# Patient Record
Sex: Male | Born: 1970
Health system: Southern US, Community
[De-identification: ages and names within clinical notes are randomized; demographics above are authoritative.]

## PROBLEM LIST (undated history)

## (undated) DIAGNOSIS — E119 Type 2 diabetes mellitus without complications: Secondary | ICD-10-CM

## (undated) DIAGNOSIS — J309 Allergic rhinitis, unspecified: Secondary | ICD-10-CM

## (undated) DIAGNOSIS — I4891 Unspecified atrial fibrillation: Secondary | ICD-10-CM

## (undated) DIAGNOSIS — N200 Calculus of kidney: Secondary | ICD-10-CM

## (undated) HISTORY — DX: Unspecified atrial fibrillation: I48.91

## (undated) HISTORY — DX: Calculus of kidney: N20.0

## (undated) HISTORY — DX: Type 2 diabetes mellitus without complications: E11.9

## (undated) HISTORY — DX: Allergic rhinitis, unspecified: J30.9

## (undated) HISTORY — PX: HERNIA REPAIR: SHX51

---

## 2013-03-01 ENCOUNTER — Ambulatory Visit: Payer: Self-pay | Admitting: Gastroenterology

## 2013-04-09 ENCOUNTER — Ambulatory Visit: Payer: Self-pay | Admitting: Gastroenterology

## 2016-03-22 ENCOUNTER — Encounter: Payer: Self-pay | Admitting: Urology

## 2016-03-22 ENCOUNTER — Ambulatory Visit (INDEPENDENT_AMBULATORY_CARE_PROVIDER_SITE_OTHER): Payer: Commercial Managed Care - HMO | Admitting: Urology

## 2016-03-22 VITALS — BP 129/80 | HR 72 | Ht 72.0 in | Wt 216.3 lb

## 2016-03-22 DIAGNOSIS — R110 Nausea: Secondary | ICD-10-CM

## 2016-03-22 DIAGNOSIS — R3129 Other microscopic hematuria: Secondary | ICD-10-CM | POA: Diagnosis not present

## 2016-03-22 DIAGNOSIS — N201 Calculus of ureter: Secondary | ICD-10-CM

## 2016-03-22 DIAGNOSIS — N132 Hydronephrosis with renal and ureteral calculous obstruction: Secondary | ICD-10-CM | POA: Diagnosis not present

## 2016-03-22 DIAGNOSIS — R109 Unspecified abdominal pain: Secondary | ICD-10-CM | POA: Diagnosis not present

## 2016-03-22 LAB — URINALYSIS, COMPLETE
Bilirubin, UA: NEGATIVE
Glucose, UA: NEGATIVE
LEUKOCYTES UA: NEGATIVE
NITRITE UA: NEGATIVE
SPEC GRAV UA: 1.025 (ref 1.005–1.030)
Urobilinogen, Ur: 0.2 mg/dL (ref 0.2–1.0)
pH, UA: 5.5 (ref 5.0–7.5)

## 2016-03-22 LAB — MICROSCOPIC EXAMINATION: BACTERIA UA: NONE SEEN

## 2016-03-22 MED ORDER — ONDANSETRON 4 MG PO TBDP: 4.0000 mg | ORAL_TABLET | Freq: Three times a day (TID) | ORAL | Status: DC | PRN

## 2016-03-22 MED ORDER — PROMETHAZINE HCL 25 MG/ML IJ SOLN
25.0000 mg | Freq: Four times a day (QID) | INTRAMUSCULAR | Status: DC | PRN
  Administered 2016-03-22: 25 mg via INTRAMUSCULAR

## 2016-03-22 MED ORDER — KETOROLAC TROMETHAMINE 60 MG/2ML IM SOLN
60.0000 mg | Freq: Once | INTRAMUSCULAR | Status: AC
  Administered 2016-03-22: 60 mg via INTRAMUSCULAR

## 2016-03-22 MED ORDER — TAMSULOSIN HCL 0.4 MG PO CAPS: 0.4000 mg | ORAL_CAPSULE | Freq: Every day | ORAL | Status: DC

## 2016-03-22 NOTE — Progress Notes (Signed)
03/22/2016 9:34 AM   Meyer Cory [birth year] [id number]  Referring provider: No referring provider defined for this encounter.  Chief Complaint  Patient presents with  . Nephrolithiasis    Patient seen in ER at [facility]    HPI: Patient is a 45 year old Caucasian male who was seen at [facility] ED in [facility] for a right mid ureteral stone presented today requesting an urgent appointment.  Patient had the sudden onset of right flank that radiated to his right side two days ago.  The pain was unbearable.  He was seen at [facility] ED on 03/20/2016.   A CT Renal Stone was performed and it noted an obstructing right mid ureteral stone.  It was approximately 2 mm in size.  I have personally reviewed the films.  A final radiology report was not available at the time of visit.  He was discharged with tamsulosin, Percocet 5/325 and Zofran 4 mg and instructed to follow up with an urologist.    Today, ain is a 10/10.  The pain is described as sharp.  It started this morning and has not abated.  It is now radiating into the right groin.  The pain is relieved by nothing and made worse by nothing. He denies gross hematuria, fevers and chills.  He is having nausea and vomiting.  He has not been able to keep food, pills or liquid down.    He does not have a previous stone hx.  He has a family hx of stones with both parents having stones.    His UA is positive for 3-10 RBC's/hpf on today's visit.     PMH: Past Medical History  Diagnosis Date  . Kidney stone     Surgical History: Past Surgical History  Procedure Laterality Date  . Hernia repair      Home Medications:    Medication List       This list is accurate as of: 03/22/16  9:34 AM.  Always use your most recent med list.               ondansetron 4 MG disintegrating tablet  Commonly known as:  ZOFRAN ODT  Take 1 tablet (4 mg total) by mouth every 8 (eight) hours as needed for nausea or vomiting.     ondansetron 4 MG tablet  Commonly known as:  ZOFRAN  Take 4 mg by mouth every 8 (eight) hours as needed for nausea or vomiting.     oxyCODONE-acetaminophen 5-325 MG tablet  Commonly known as:  PERCOCET/ROXICET  Take 1-2 tablets by mouth every 4 (four) hours as needed for severe pain.     tamsulosin 0.4 MG Caps capsule  Commonly known as:  FLOMAX  Take 1 capsule (0.4 mg total) by mouth daily.        Allergies: No Known Allergies  Family History: Family History  Problem Relation Age of Onset  . Kidney disease Neg Hx   . Prostate cancer Neg Hx   . Kidney Stones Father   . Kidney Stones Mother     Social History:  reports that he has never smoked. He does not have any smokeless tobacco history on file. He reports that he does not drink alcohol or use illicit drugs.  ROS: UROLOGY Frequent Urination?: No Hard to postpone urination?: No Burning/pain with urination?: No Get up at night to urinate?: No Leakage of urine?: No Urine stream starts and stops?: No Trouble starting stream?: No Do you  have to strain to urinate?: No Blood in urine?: Yes Urinary tract infection?: No Sexually transmitted disease?: No Injury to kidneys or bladder?: No Painful intercourse?: No Weak stream?: No Erection problems?: No Penile pain?: No  Gastrointestinal Nausea?: No Vomiting?: No Indigestion/heartburn?: No Diarrhea?: No Constipation?: No  Constitutional Fever: No Night sweats?: No Weight loss?: No Fatigue?: No  Skin Skin rash/lesions?: No Itching?: No  Eyes Blurred vision?: No Double vision?: No  Ears/Nose/Throat Sore throat?: No Sinus problems?: No  Hematologic/Lymphatic Swollen glands?: No Easy bruising?: No  Cardiovascular Leg swelling?: No Chest pain?: No  Respiratory Cough?: No Shortness of breath?: No  Endocrine Excessive thirst?: No  Musculoskeletal Back pain?: No Joint pain?: No  Neurological Headaches?: No Dizziness?:  No  Psychologic Depression?: No Anxiety?: No  Physical Exam: BP 129/80 mmHg  Pulse 72  Ht 6' (1.829 m)  Wt 216 lb 4.8 oz (98.113 kg)  BMI 29.33 kg/m2  Constitutional: Well nourished. Alert and oriented, No acute distress. HEENT: [place] AT, moist mucus membranes. Trachea midline, no masses. Cardiovascular: No clubbing, cyanosis, or edema. Respiratory: Normal respiratory effort, no increased work of breathing. GI: Abdomen is soft, non tender, non distended, no abdominal masses.  GU: Right CVA tenderness.  No bladder fullness or masses.   Rectal: Deferred.   Skin: No rashes, bruises or suspicious lesions. Lymph: No cervical or inguinal adenopathy. Neurologic: Grossly intact, no focal deficits, moving all 4 extremities. Psychiatric: Normal mood and affect.  Laboratory Data: Urinalysis Results for orders placed or performed in visit on 03/22/16  Microscopic Examination  Result Value Ref Range   WBC, UA 0-5 0 -  5 /hpf   RBC, UA 3-10 (A) 0 -  2 /hpf   Epithelial Cells (non renal) 0-10 0 - 10 /hpf   Mucus, UA Present (A) Not Estab.   Bacteria, UA None seen None seen/Few  Urinalysis, Complete  Result Value Ref Range   Specific Gravity, UA 1.025 1.005 - 1.030   pH, UA 5.5 5.0 - 7.5   Color, UA Yellow Yellow   Appearance Ur Clear Clear   Leukocytes, UA Negative Negative   Protein, UA Trace (A) Negative/Trace   Glucose, UA Negative Negative   Ketones, UA 2+ (A) Negative   RBC, UA 2+ (A) Negative   Bilirubin, UA Negative Negative   Urobilinogen, Ur 0.2 0.2 - 1.0 mg/dL   Nitrite, UA Negative Negative   Microscopic Examination See below:     Assessment & Plan:    1. Right mid ureteral stone:   [place] ureteral stone approximately 2 mm in size.  We discussed MET, ESWL and URS/LL/ureteral stent placement.  I suggested that due to the size of the stone, MET would be reasonable.  I have given him a strainer and  instructed him to strain his urine.  I have refilled his tamsulosin.  He should contact our office or seek treatment in the ED if he becomes febrile or pain and difficult control in order to arrange for emergent/urgent intervention.   I will request records from [facility] ED.  He will follow up in one week for KUB, UA and exam.    - Urinalysis, Complete  2. Right flank pain:   Patient is given 60 mg IM of Toradol in the office today.  He will continue the Percocet 5/325 given to him by the ED.    3. Nausea:   Patient is given 27  mg IM of Phenergan in the office today.  I have sent a prescription for Zofran 4 mg ODT for the nausea.    4. Hydronephrosis:   Patient was found to have right hydronephrosis due to a right ureteral stone.  A RUS will be obtained one month after he has passed the stone.    5. Microscopic hematuria:   We will continue to monitor the patient's UA after the treatment/passage of the stone to ensure the hematuria has resolved.  If hematuria persists, we will pursue a hematuria workup with CT Urogram and cystoscopy if appropriate.  He will return next week for an UA.     Return in about 1 week (around 03/29/2016) for KUB, UA and exam.  These notes generated with voice recognition software. I apologize for typographical errors.  Zara Council, [facility] [address] [place], [place] [address] [phone]

## 2016-03-22 NOTE — Progress Notes (Signed)
IM Injection  Patient is present today for an IM Injection for treatment of pain from stone. Drug: Toradol Dose:60 mg Location:Right Upper outer Glut Lot: [id number] Exp:10/28/2016 Patient tolerated well, no complications were noted  Preformed by: Dallas Schimkeamona Williams CMA  IM Injection  Patient is present today for an IM Injection for treatment of Kidney stone. Drug: Phenergan Dose:25 mg Location:Left upper outer Glut Lot: [id number] Exp:08/2016 Patient tolerated well, no complications were noted  Preformed by: Dallas Schimkeamona Williams CMA

## 2016-03-29 ENCOUNTER — Encounter: Payer: Self-pay | Admitting: Urology

## 2016-03-29 ENCOUNTER — Ambulatory Visit
Admission: RE | Admit: 2016-03-29 | Discharge: 2016-03-29 | Disposition: A | Discharge status: Home / Self Care | Payer: Commercial Managed Care - HMO | Source: Ambulatory Visit | Attending: Urology | Admitting: Urology

## 2016-03-29 ENCOUNTER — Ambulatory Visit (INDEPENDENT_AMBULATORY_CARE_PROVIDER_SITE_OTHER): Payer: Commercial Managed Care - HMO | Admitting: Urology

## 2016-03-29 ENCOUNTER — Other Ambulatory Visit: Payer: Self-pay | Admitting: *Deleted

## 2016-03-29 VITALS — BP 146/76 | HR 97 | Ht 72.0 in | Wt 210.0 lb

## 2016-03-29 DIAGNOSIS — N201 Calculus of ureter: Secondary | ICD-10-CM | POA: Diagnosis not present

## 2016-03-29 DIAGNOSIS — R3129 Other microscopic hematuria: Secondary | ICD-10-CM

## 2016-03-29 DIAGNOSIS — N132 Hydronephrosis with renal and ureteral calculous obstruction: Secondary | ICD-10-CM

## 2016-03-29 DIAGNOSIS — R109 Unspecified abdominal pain: Secondary | ICD-10-CM | POA: Diagnosis not present

## 2016-03-29 DIAGNOSIS — R938 Abnormal findings on diagnostic imaging of other specified body structures: Secondary | ICD-10-CM | POA: Diagnosis not present

## 2016-03-29 LAB — URINALYSIS, COMPLETE
BILIRUBIN UA: NEGATIVE
Glucose, UA: NEGATIVE
Nitrite, UA: NEGATIVE
PH UA: 5 (ref 5.0–7.5)
PROTEIN UA: NEGATIVE
Specific Gravity, UA: 1.015 (ref 1.005–1.030)
UUROB: 0.2 mg/dL (ref 0.2–1.0)

## 2016-03-29 LAB — MICROSCOPIC EXAMINATION: BACTERIA UA: NONE SEEN

## 2016-03-29 MED ORDER — OXYCODONE-ACETAMINOPHEN 10-325 MG PO TABS: 1.0000 | ORAL_TABLET | ORAL | Status: DC | PRN

## 2016-03-29 NOTE — Progress Notes (Signed)
9:20 AM   Michael Malone 05/22/1971 [id number]  Referring provider: No referring provider defined for this encounter.  Chief Complaint  Patient presents with  . ureteral stone    1 week follow up    HPI: Patient is a 45 year old Caucasian male who presents today for a one week for recheck of UA for hematuria and KUB for a right ureteral stone.  Background history Patient was seen at [facility] ED in [facility] for a right mid ureteral stone presented today requesting an urgent appointment.  Patient had the sudden onset of right flank that radiated to his right side two days ago.  The pain was unbearable.  He was seen at [facility] ED on 03/20/2016.   A CT Renal Stone was performed and it noted an obstructing right mid ureteral stone.  It was approximately 2 mm in size.  I have personally reviewed the films.  A final radiology report was not available at the time of visit.  He was discharged with tamsulosin, Percocet 5/325 and Zofran 4 mg and instructed to follow up with an urologist.  He does not have a previous stone hx.  He has a family hx of stones with both parents having stones.    Over the last week, he was still experiencing renal colic.  On Tuesday and Wednesday his pain was 5-6/10. His pain medication was controlling the pain at that time. Then on Thursday, he had taken all of his pain medication and was seen at [facility] urgent care and given a refill of his pain medication.  The pain continued into Friday. Then on Saturday morning,  he did not experience pain and passed a fleshy material.  He had not been able to tolerate food or liquid over the last week due to nausea and vomiting. On Saturday he was able to tolerate regular food and fluids. He found himself tired and lethargic. Sunday morning he did not experience pain continued to eat normally and increase his fluid intake. Then on Sunday evening he had mild right-sided flank pain that increased to 4-5/10. This morning, he  has not had any further pain.  His urine is positive for greater than 30 WBCs and 11-30 RBCs per high-power field.      PMH: Past Medical History  Diagnosis Date  . Kidney stone     Surgical History: Past Surgical History  Procedure Laterality Date  . Hernia repair      Home Medications:    Medication List       This list is accurate as of: 03/29/16  9:20 AM.  Always use your most recent med list.               ondansetron 4 MG disintegrating tablet  Commonly known as:  ZOFRAN ODT  Take 1 tablet (4 mg total) by mouth every 8 (eight) hours as needed for nausea or vomiting.     ondansetron 4 MG tablet  Commonly known as:  ZOFRAN  Take 4 mg by mouth every 8 (eight) hours as needed for nausea or vomiting. Reported on 03/29/2016     oxyCODONE-acetaminophen 10-325 MG tablet  Commonly known as:  PERCOCET  Take 1 tablet by mouth every 4 (four) hours as needed for pain.     tamsulosin 0.4 MG Caps capsule  Commonly known as:  FLOMAX  Take 1 capsule (0.4 mg total) by mouth daily.        Allergies: No Known Allergies  Family History: Family History  Problem Relation Age of Onset  . Kidney disease Neg Hx   . Prostate cancer Neg Hx   . Kidney Stones Father   . Kidney Stones Mother     Social History:  reports that he has never smoked. He does not have any smokeless tobacco history on file. He reports that he does not drink alcohol or use illicit drugs.  ROS: UROLOGY Frequent Urination?: No Hard to postpone urination?: No Burning/pain with urination?: Yes Get up at night to urinate?: No Leakage of urine?: No Urine stream starts and stops?: No Trouble starting stream?: No Do you have to strain to urinate?: No Blood in urine?: No Urinary tract infection?: No Sexually transmitted disease?: No Injury to kidneys or bladder?: No Painful intercourse?: No Weak stream?: No Erection problems?: No Penile pain?: No  Gastrointestinal Nausea?: No Vomiting?:  No Indigestion/heartburn?: No Diarrhea?: No Constipation?: No  Constitutional Fever: No Night sweats?: No Weight loss?: No Fatigue?: No  Skin Skin rash/lesions?: No Itching?: No  Eyes Blurred vision?: No Double vision?: No  Ears/Nose/Throat Sore throat?: No Sinus problems?: No  Hematologic/Lymphatic Swollen glands?: No Easy bruising?: No  Cardiovascular Leg swelling?: No Chest pain?: No  Respiratory Cough?: No Shortness of breath?: No  Endocrine Excessive thirst?: No  Musculoskeletal Back pain?: No Joint pain?: No  Neurological Headaches?: No Dizziness?: No  Psychologic Depression?: No Anxiety?: No  Physical Exam: BP 146/76 mmHg  Pulse 97  Ht 6' (1.829 m)  Wt 210 lb (95.255 kg)  BMI 28.47 kg/m2  Constitutional: Well nourished. Alert and oriented, No acute distress. HEENT: [place] AT, moist mucus membranes. Trachea midline, no masses. Cardiovascular: No clubbing, cyanosis, or edema. Respiratory: Normal respiratory effort, no increased work of breathing. GI: Abdomen is soft, non tender, non distended, no abdominal masses.  GU: Right CVA tenderness.  No bladder fullness or masses.   Rectal: Deferred.   Skin: No rashes, bruises or suspicious lesions. Lymph: No cervical or inguinal adenopathy. Neurologic: Grossly intact, no focal deficits, moving all 4 extremities. Psychiatric: Normal mood and affect.  Laboratory Data: Urinalysis Results for orders placed or performed in visit on 03/29/16  Microscopic Examination  Result Value Ref Range   WBC, UA >30 (A) 0 -  5 /hpf   RBC, UA 11-30 (A) 0 -  2 /hpf   Epithelial Cells (non renal) 0-10 0 - 10 /hpf   Bacteria, UA None seen None seen/Few  Urinalysis, Complete  Result Value Ref Range   Specific Gravity, UA 1.015 1.005 - 1.030   pH, UA 5.0 5.0 - 7.5   Color, UA Yellow Yellow   Appearance Ur Cloudy (A) Clear   Leukocytes, UA 2+ (A) Negative   Protein, UA Negative Negative/Trace   Glucose, UA  Negative Negative   Ketones, UA 1+ (A) Negative   RBC, UA 2+ (A) Negative   Bilirubin, UA Negative Negative   Urobilinogen, Ur 0.2 0.2 - 1.0 mg/dL   Nitrite, UA Negative Negative   Microscopic Examination See below:     Assessment & Plan:    1. Right mid ureteral stone:   [place] mid ureteral stone approximately 2 mm in size.  He would like to continue MET at this time.   He is given a refill of his Percocet.  He should contact our office or seek treatment in the ED if he becomes febrile or pain and difficult control in order to  arrange for emergent/urgent intervention.   Records reviewed from [facility] ED.  Final radiologist report was not included in the notes.  I will request it.  He will follow up in one month for RUS, UA and exam.    - Urinalysis, Complete  2. Right flank pain:   Patient is given a refill of Percocet 10/325.    3. Nausea:   Resolved.    4. Hydronephrosis:   Patient was found to have right hydronephrosis due to a right ureteral stone.  A RUS will be obtained in one month to ensure the hydronephrosis has resolved.      5. Microscopic hematuria:   We will continue to monitor the patient's UA after the treatment/passage of the stone to ensure the hematuria has resolved.  If hematuria persists, we will pursue a hematuria workup with CT Urogram and cystoscopy if appropriate.  He will return next mouth for an UA.     Return in about 1 month (around 04/28/2016) for RUS report and UA.  These notes generated with voice recognition software. I apologize for typographical errors.  Zara Council, [facility] [address] [place], [place] [address] [phone]

## 2016-04-03 ENCOUNTER — Telehealth: Payer: Self-pay | Admitting: Urology

## 2016-04-03 NOTE — Telephone Encounter (Signed)
I do not have the radiologist's report from this gentleman's CT scan [facility]. Would you please request this report?

## 2016-04-04 ENCOUNTER — Other Ambulatory Visit: Payer: Self-pay

## 2016-04-04 NOTE — Telephone Encounter (Signed)
I called and requested the CT scan from the medical records people. They said if they can find it they will send it.  Michael Malone

## 2016-04-15 ENCOUNTER — Ambulatory Visit
Admission: RE | Admit: 2016-04-15 | Discharge: 2016-04-15 | Disposition: A | Discharge status: Home / Self Care | Payer: Commercial Managed Care - HMO | Source: Ambulatory Visit | Attending: Urology | Admitting: Urology

## 2016-04-15 DIAGNOSIS — N132 Hydronephrosis with renal and ureteral calculous obstruction: Secondary | ICD-10-CM | POA: Insufficient documentation

## 2016-04-19 ENCOUNTER — Other Ambulatory Visit: Payer: Self-pay | Admitting: Urology

## 2016-04-28 ENCOUNTER — Ambulatory Visit (INDEPENDENT_AMBULATORY_CARE_PROVIDER_SITE_OTHER): Payer: Commercial Managed Care - HMO | Admitting: Urology

## 2016-04-28 ENCOUNTER — Encounter: Payer: Self-pay | Admitting: Urology

## 2016-04-28 VITALS — BP 119/83 | HR 73 | Wt 215.3 lb

## 2016-04-28 DIAGNOSIS — N201 Calculus of ureter: Secondary | ICD-10-CM | POA: Diagnosis not present

## 2016-04-28 DIAGNOSIS — R109 Unspecified abdominal pain: Secondary | ICD-10-CM

## 2016-04-28 DIAGNOSIS — R3129 Other microscopic hematuria: Secondary | ICD-10-CM

## 2016-04-28 DIAGNOSIS — N1330 Unspecified hydronephrosis: Secondary | ICD-10-CM

## 2016-04-28 LAB — MICROSCOPIC EXAMINATION
Bacteria, UA: NONE SEEN
EPITHELIAL CELLS (NON RENAL): NONE SEEN /HPF (ref 0–10)

## 2016-04-28 LAB — URINALYSIS, COMPLETE
Bilirubin, UA: NEGATIVE
Glucose, UA: NEGATIVE
KETONES UA: NEGATIVE
Leukocytes, UA: NEGATIVE
NITRITE UA: NEGATIVE
Protein, UA: NEGATIVE
Urobilinogen, Ur: 0.2 mg/dL (ref 0.2–1.0)
pH, UA: 5 (ref 5.0–7.5)

## 2016-04-28 NOTE — Progress Notes (Signed)
9:00 AM   Michael Malone [birth year] [id number]  Referring provider: No referring provider defined for this encounter.  Chief Complaint  Patient presents with  . Results    RUS Report and Stone analysis    HPI: Patient is a 45 year old WM who presents today for recheck of UA for hematuria, KUB and RUS report.    Background history Patient was seen at [facility] ED in [facility] for a right mid ureteral stone presented today requesting an urgent appointment.  Patient had the sudden onset of right flank that radiated to his right side two days ago.  The pain was unbearable.  He was seen at [facility] ED on 03/20/2016.   A CT Renal Stone was performed and it noted an obstructing right mid ureteral stone.  It was approximately 2 mm in size.  I have personally reviewed the films.  A final radiology report was not available at the time of visit.  He was discharged with tamsulosin, Percocet 5/325 and Zofran 4 mg and instructed to follow up with an urologist.  He does not have a previous stone hx.  He has a family hx of stones with both parents having stones.   Over the last week, he was still experiencing renal colic.  On Tuesday and Wednesday his pain was 5-6/10. His pain medication was controlling the pain at that time. Then on Thursday, he had taken all of his pain medication and was seen at [facility] urgent care and given a refill of his pain medication.  The pain continued into Friday. Then on Saturday morning,  he did not experience pain and passed a fleshy material.  He had not been able to tolerate food or liquid over the last week due to nausea and vomiting. On Saturday he was able to tolerate regular food and fluids. He found himself tired and lethargic. Sunday morning he did not experience pain continued to eat normally and increase his fluid intake. Then on Sunday evening he had mild right-sided flank pain that increased to 4-5/10. This morning, he has not had any further pain.  Stone  was passed and sent for analysis.  Today, patient is pain free. He has not had any gross hematuria, flank pain or nausea. He has not had fevers, chills, nausea or vomiting. Renal ultrasound completed on 04/15/2016 was normal. I personally reviewed the images. His stone analysis noted the stone composition and 95% calcium oxalate monohydrate, 2% calcium oxalate dihydrate and 3% calcium phosphate carbonate. His UA today was positive for 3-10 rbc's per high-power field.   PMH: Past Medical History  Diagnosis Date  . Kidney stone     Surgical History: Past Surgical History  Procedure Laterality Date  . Hernia repair      Home Medications:    Medication List       This list is accurate as of: 04/28/16  9:00 AM.  Always use your most recent med list.               ondansetron 4 MG disintegrating tablet  Commonly known as:  ZOFRAN ODT  Take 1 tablet (4 mg total) by mouth every 8 (eight) hours as needed for nausea or vomiting.     ondansetron 4 MG tablet  Commonly known as:  ZOFRAN  Take 4 mg by mouth every 8 (eight) hours as needed for nausea or vomiting. Reported on 04/28/2016     oxyCODONE-acetaminophen 10-325 MG tablet  Commonly known as:  PERCOCET  Take 1 tablet by mouth every 4 (four) hours as needed for pain.     tamsulosin 0.4 MG Caps capsule  Commonly known as:  FLOMAX  Take 1 capsule (0.4 mg total) by mouth daily.        Allergies: No Known Allergies  Family History: Family History  Problem Relation Age of Onset  . Kidney disease Neg Hx   . Prostate cancer Neg Hx   . Kidney Stones Father   . Kidney Stones Mother     Social History:  reports that he has never smoked. He does not have any smokeless tobacco history on file. He reports that he does not drink alcohol or use illicit drugs.  ROS: UROLOGY Frequent Urination?: No Hard to postpone urination?: No Burning/pain with urination?: No Get up at night to urinate?: No Leakage of urine?: No Urine stream  starts and stops?: No Trouble starting stream?: No Do you have to strain to urinate?: No Blood in urine?: No Urinary tract infection?: No Sexually transmitted disease?: No Injury to kidneys or bladder?: No Painful intercourse?: No Weak stream?: No Erection problems?: No Penile pain?: No  Gastrointestinal Nausea?: No Vomiting?: No Indigestion/heartburn?: No Diarrhea?: No Constipation?: No  Constitutional Fever: No Night sweats?: No Weight loss?: No Fatigue?: No  Skin Skin rash/lesions?: No Itching?: No  Eyes Blurred vision?: No Double vision?: No  Ears/Nose/Throat Sore throat?: No Sinus problems?: No  Hematologic/Lymphatic Swollen glands?: No Easy bruising?: No  Cardiovascular Leg swelling?: No Chest pain?: No  Respiratory Cough?: No Shortness of breath?: No  Endocrine Excessive thirst?: No  Musculoskeletal Back pain?: No Joint pain?: No  Neurological Headaches?: No Dizziness?: No  Psychologic Depression?: No Anxiety?: No  Physical Exam: BP 119/83 mmHg  Pulse 73  Wt 215 lb 4.8 oz (97.659 kg)  Constitutional: Well nourished. Alert and oriented, No acute distress. HEENT: [place] AT, moist mucus membranes. Trachea midline, no masses. Cardiovascular: No clubbing, cyanosis, or edema. Respiratory: Normal respiratory effort, no increased work of breathing. GI: Abdomen is soft, non tender, non distended, no abdominal masses.  GU: Right CVA tenderness.  No bladder fullness or masses.   Rectal: Deferred.   Skin: No rashes, bruises or suspicious lesions. Lymph: No cervical or inguinal adenopathy. Neurologic: Grossly intact, no focal deficits, moving all 4 extremities. Psychiatric: Normal mood and affect.  Laboratory Data: Urinalysis Results for orders placed or performed in visit on 04/28/16  Microscopic Examination  Result Value Ref Range   WBC, UA 0-5 0 -  5 /hpf   RBC, UA 3-10 (A) 0 -  2 /hpf   Epithelial Cells (non renal) None seen 0 - 10  /hpf   Bacteria, UA None seen None seen/Few  Urinalysis, Complete  Result Value Ref Range   Specific Gravity, UA >1.030 (H) 1.005 - 1.030   pH, UA 5.0 5.0 - 7.5   Color, UA Yellow Yellow   Appearance Ur Clear Clear   Leukocytes, UA Negative Negative   Protein, UA Negative Negative/Trace   Glucose, UA Negative Negative   Ketones, UA Negative Negative   RBC, UA 1+ (A) Negative   Bilirubin, UA Negative Negative   Urobilinogen, Ur 0.2 0.2 - 1.0 mg/dL   Nitrite, UA Negative Negative   Microscopic Examination See below:     Pertinent Imaging: CLINICAL DATA: Followup right hydronephrosis due to right ureteral calculus seen on outside CT 2 weeks ago.  EXAM: RENAL / URINARY TRACT ULTRASOUND COMPLETE  COMPARISON: None.  FINDINGS: Right Kidney:  Length: 11.1 cm. Echogenicity within normal limits. No mass or hydronephrosis visualized. No definite shadowing renal calculi visualized.  Left Kidney:  Length: 11.8 cm. Echogenicity within normal limits. No mass or hydronephrosis visualized. No definite shadowing renal calculi visualized.  Bladder:  Appears normal for degree of bladder distention.  IMPRESSION: Normal study. No evidence of hydronephrosis.  No definite shadowing renal calculi visualized ; note that small renal calculi cannot be excluded by ultrasound.   Electronically Signed  By: Myles RosenthalJohn Stahl M.D.  On: 04/15/2016 13:28          Assessment & Plan:    1. Right mid ureteral stone:   [place] ureteral stone approximately 2 mm in size.  I was not able to obtain a final radiologist report.  Stone analysis noted a composition of 95% calcium oxalate monohydrate, 2% calcium oxalate dihydrate and 3% calcium phosphate carbonate.  I have encouraged him to increase water intake to 2.5 L daily, increase citrus juices and decrease protein and salt in his diet.      2. Right flank pain:    Resolved.    3. Hydronephrosis:   The hydronephrosis has resolved.      4. Microscopic hematuria:    He will return next month for an UA.   If hematuria persists, we will pursue a hematuria workup with CT Urogram and cystoscopy if appropriate.   - Urinalysis, Complete   Return in about 1 month (around 05/29/2016) for UA only.  These notes generated with voice recognition software. I apologize for typographical errors.  Michiel CowboySHANNON Tyrrell Stephens, PA-C  [facility] [address], [address] [place], [place] [address] [phone]

## 2016-05-27 ENCOUNTER — Other Ambulatory Visit: Payer: Self-pay

## 2016-05-27 DIAGNOSIS — R3129 Other microscopic hematuria: Secondary | ICD-10-CM

## 2016-05-30 ENCOUNTER — Other Ambulatory Visit: Payer: Commercial Managed Care - HMO

## 2016-05-30 DIAGNOSIS — R3129 Other microscopic hematuria: Secondary | ICD-10-CM

## 2016-05-30 LAB — URINALYSIS, COMPLETE
BILIRUBIN UA: NEGATIVE
GLUCOSE, UA: NEGATIVE
KETONES UA: NEGATIVE
Leukocytes, UA: NEGATIVE
NITRITE UA: NEGATIVE
PROTEIN UA: NEGATIVE
UUROB: 0.2 mg/dL (ref 0.2–1.0)
pH, UA: 5 (ref 5.0–7.5)

## 2016-05-30 LAB — MICROSCOPIC EXAMINATION

## 2016-09-04 IMAGING — CR DG ABDOMEN 1V
1 series · 1 of 1 positions shown · non-contrast
Comparison: None.

CLINICAL DATA: Right-sided flank pain for 2 days

EXAM:
ABDOMEN - 1 VIEW

[dg abd 1 view]
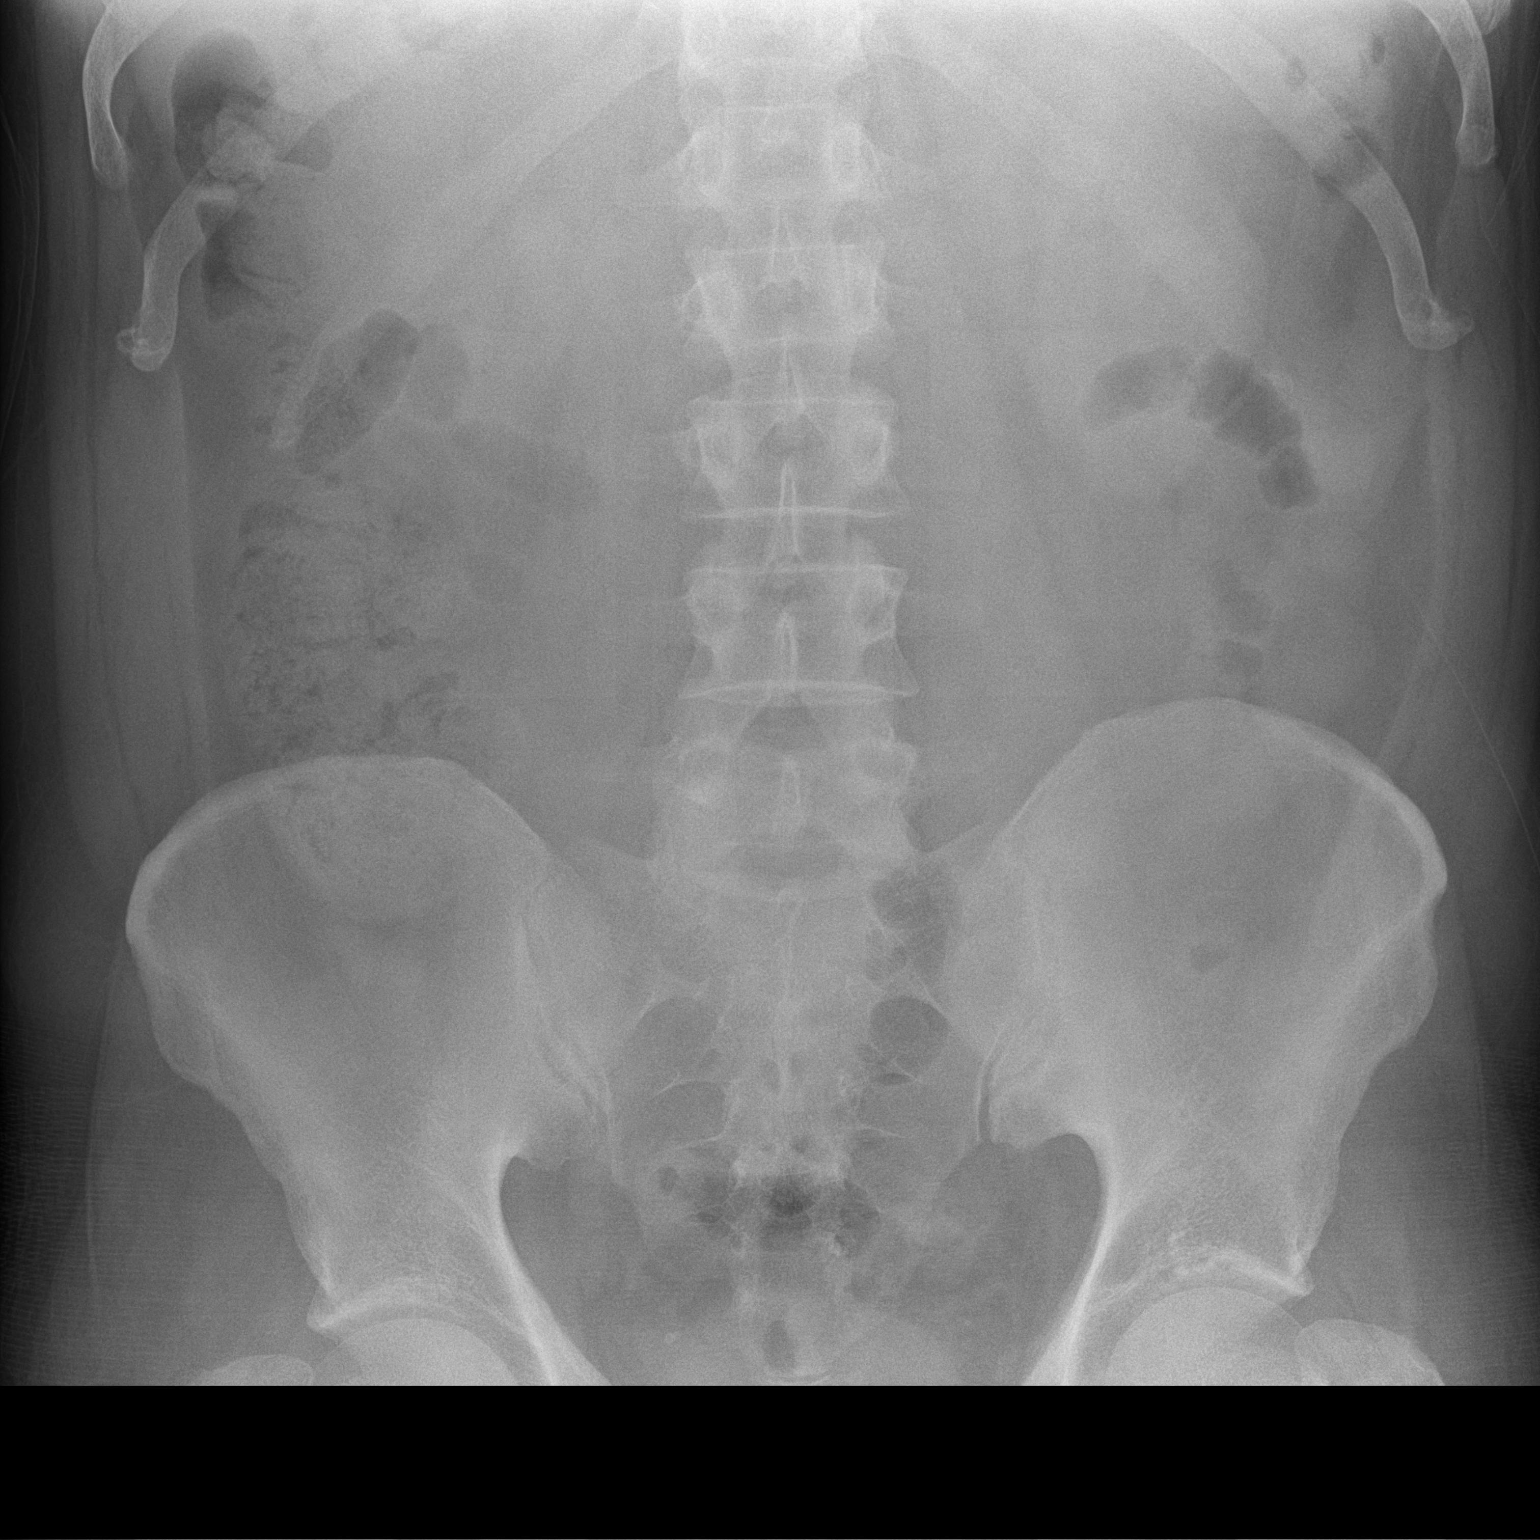

[1 of 1 positions shown; findings below may reference images not displayed]

FINDINGS: There is moderate stool in the colon. There is no bowel dilatation
or air-fluid level suggesting obstruction. No free air. Pelvic
calcifications most likely represent phleboliths. No other abnormal
calcifications noted.
IMPRESSION: Probable phleboliths in the pelvis. No other abnormal calcifications
noted. Bowel gas pattern unremarkable.

## 2018-03-13 IMAGING — US US RENAL
1 series · 14 of 23 positions shown · non-contrast
Comparison: None.

CLINICAL DATA: Followup right hydronephrosis due to right ureteral
calculus seen on outside CT 2 weeks ago.

EXAM:
RENAL / URINARY TRACT ULTRASOUND COMPLETE

[Series 1: us renal · 0.24mm/px · 14 of 23 slices shown]
[im 1/23]
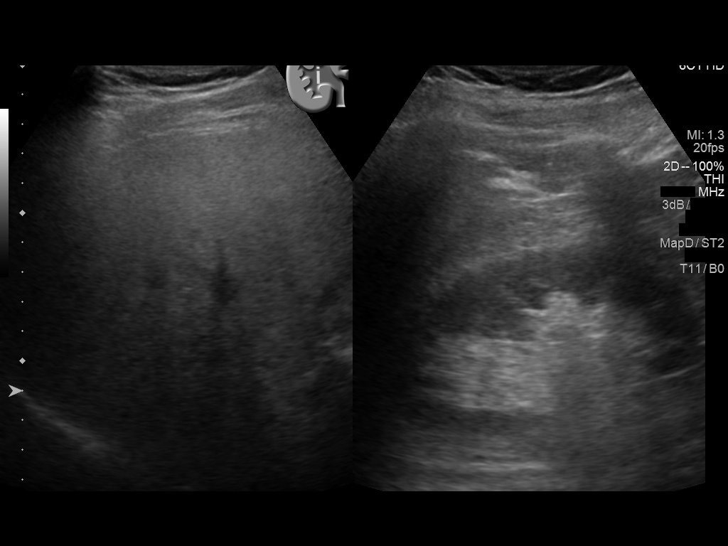
[im 3/23]
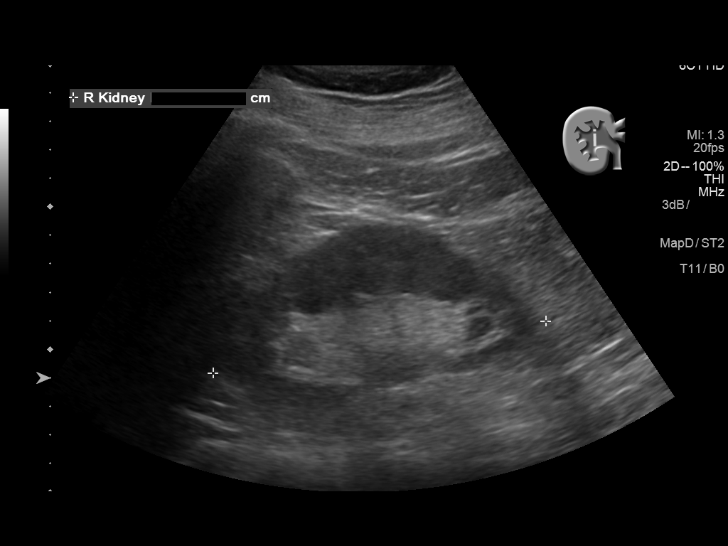
[im 5/23]
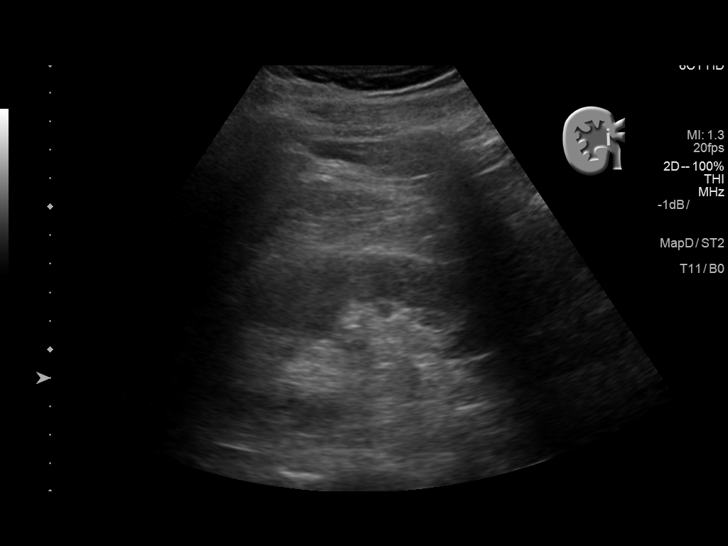
[im 6/23]
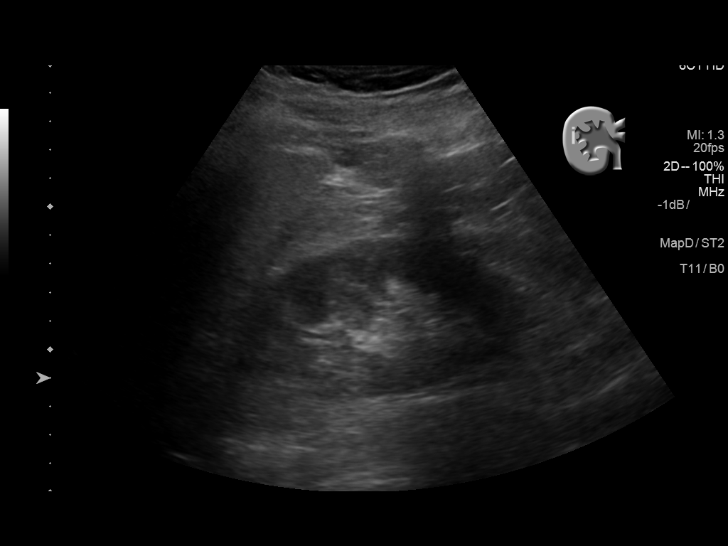
[im 8/23]
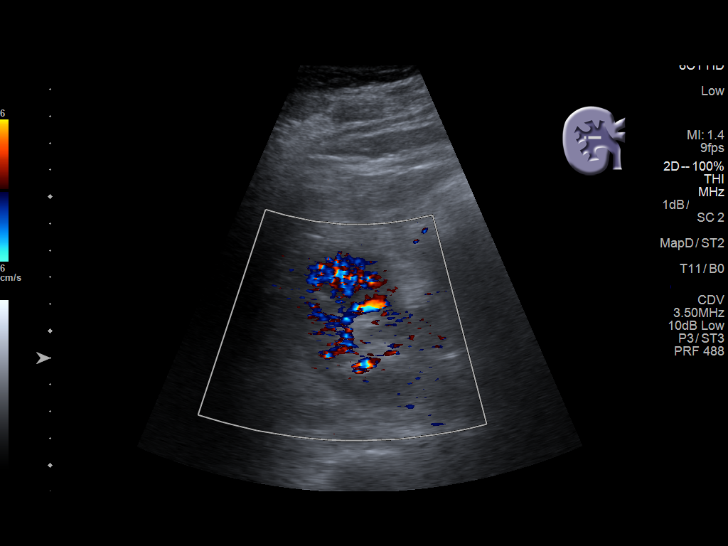
[im 10/23]
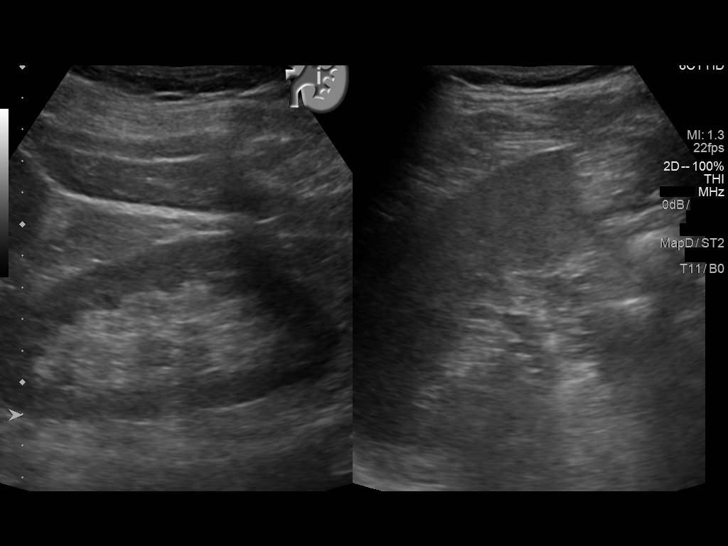
[im 11/23]
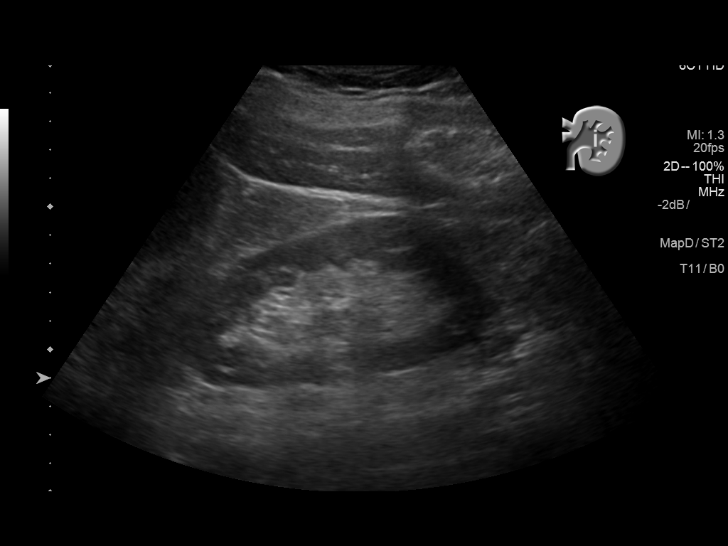
[im 13/23]
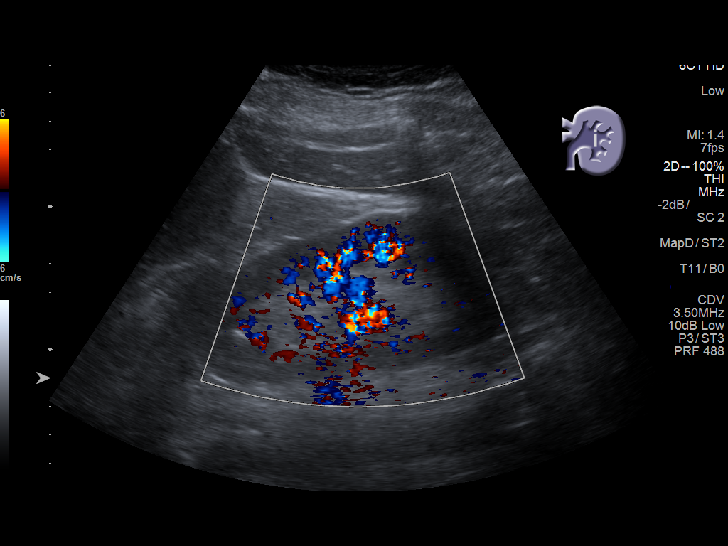
[im 14/23]
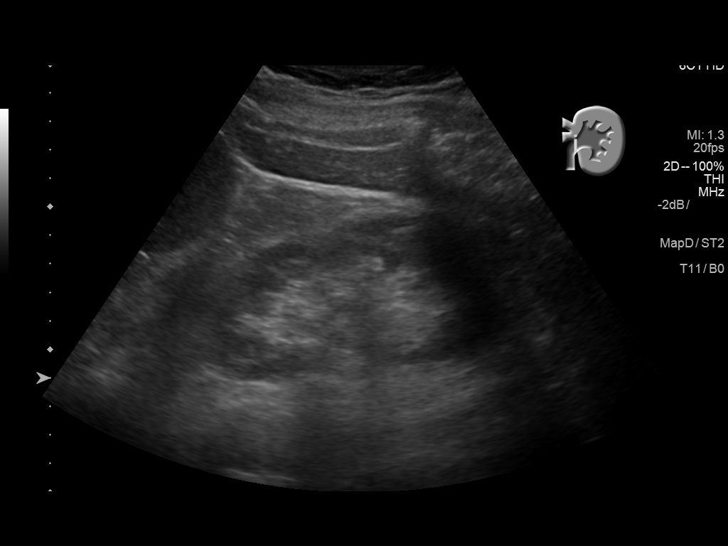
[im 16/23]
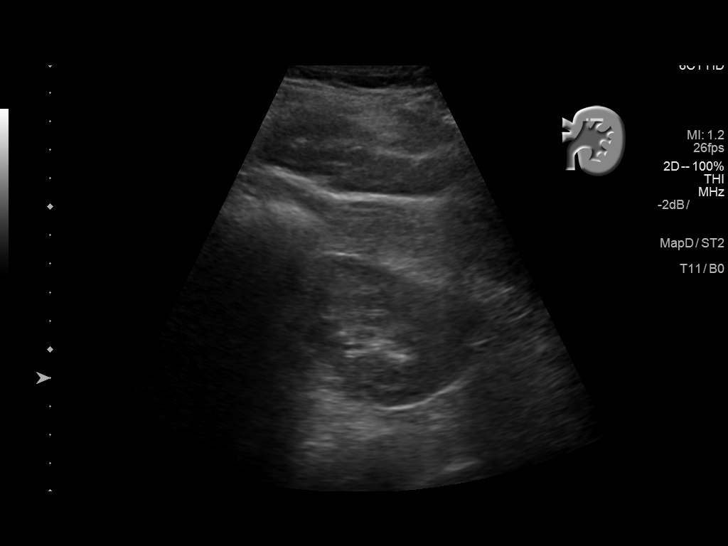
[im 18/23]
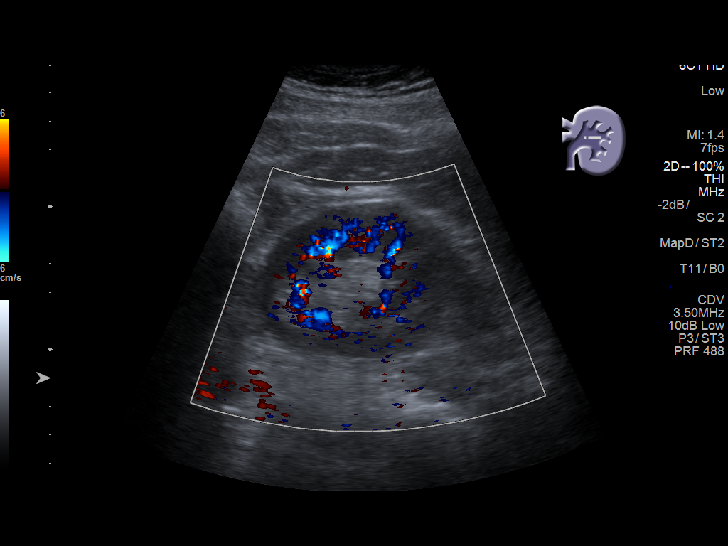
[im 19/23]
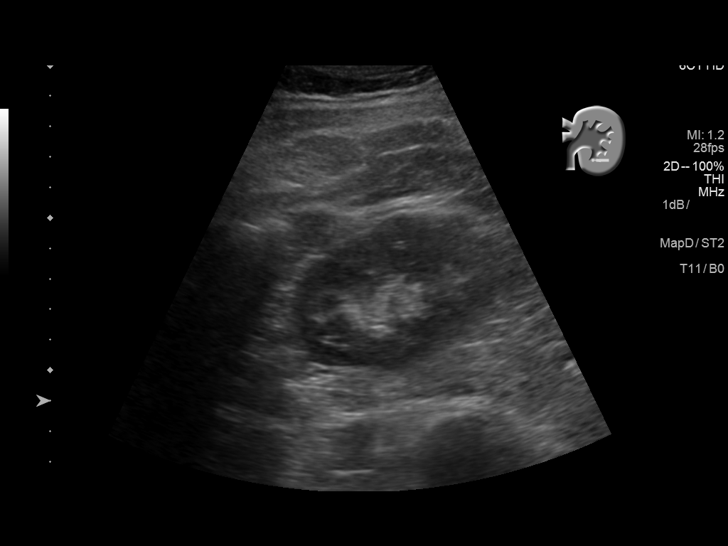
[im 21/23]
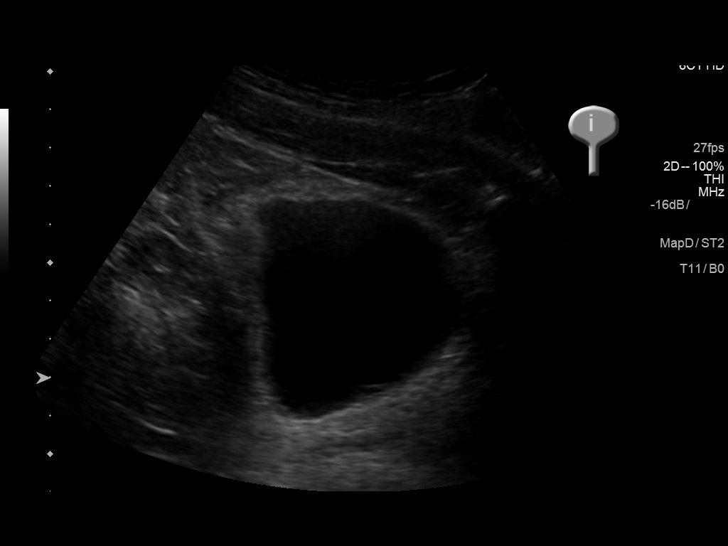
[im 23/23]
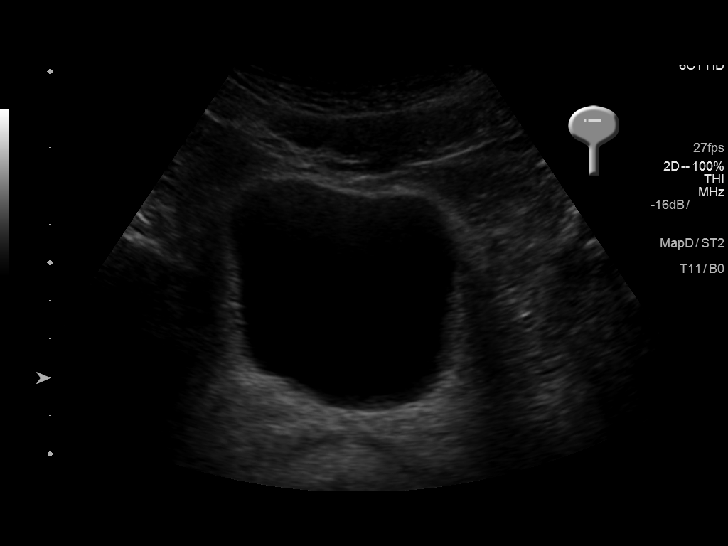

[14 of 23 positions shown; findings below may reference images not displayed]

FINDINGS: Right Kidney:

Length: 11.1 cm. Echogenicity within normal limits. No mass or
hydronephrosis visualized. No definite shadowing renal calculi
visualized.

Left Kidney:

Length: 11.8 cm. Echogenicity within normal limits. No mass or
hydronephrosis visualized. No definite shadowing renal calculi
visualized.

Bladder:

Appears normal for degree of bladder distention.
IMPRESSION: Normal study.  No evidence of hydronephrosis.

No definite shadowing renal calculi visualized ; note that small
renal calculi cannot be excluded by ultrasound.

## 2018-10-09 DIAGNOSIS — E119 Type 2 diabetes mellitus without complications: Secondary | ICD-10-CM | POA: Insufficient documentation

## 2020-01-15 NOTE — Progress Notes (Signed)
ellowScheduled to complete physical with Durward Parcel, PA-C on 01/23/20.  AMD

## 2020-01-17 ENCOUNTER — Ambulatory Visit: Payer: 59

## 2020-01-17 ENCOUNTER — Other Ambulatory Visit: Payer: Self-pay

## 2020-01-17 DIAGNOSIS — Z0000 Encounter for general adult medical examination without abnormal findings: Secondary | ICD-10-CM

## 2020-01-17 LAB — POCT URINALYSIS DIPSTICK
Bilirubin, UA: NEGATIVE
Blood, UA: NEGATIVE
Glucose, UA: POSITIVE — AB
Ketones, UA: NEGATIVE
Leukocytes, UA: NEGATIVE
Nitrite, UA: NEGATIVE
Protein, UA: NEGATIVE
Spec Grav, UA: 1.025 (ref 1.010–1.025)
Urobilinogen, UA: 0.2 E.U./dL
pH, UA: 6 (ref 5.0–8.0)

## 2020-01-21 LAB — CMP12+LP+TP+TSH+6AC+PSA+CBC…
ALT: 38 IU/L (ref 0–44)
AST: 27 IU/L (ref 0–40)
Albumin/Globulin Ratio: 1.4 (ref 1.2–2.2)
BUN/Creatinine Ratio: 12 (ref 9–20)
BUN: 12 mg/dL (ref 6–24)
Basophils Absolute: 0 10*3/uL (ref 0.0–0.2)
Basos: 0 %
Bilirubin Total: 0.8 mg/dL (ref 0.0–1.2)
Calcium: 9.1 mg/dL (ref 8.7–10.2)
Chloride: 102 mmol/L (ref 96–106)
Cholesterol, Total: 172 mg/dL (ref 100–199)
Creatinine, Ser: 1.03 mg/dL (ref 0.76–1.27)
Eos: 5 %
Estimated CHD Risk: 0.9 times avg. (ref 0.0–1.0)
GFR calc non Af Amer: 86 mL/min/{1.73_m2} (ref >59)
GGT: 44 IU/L (ref 0–65)
Globulin, Total: 3 g/dL (ref 1.5–4.5)
Glucose: 202 mg/dL — ABNORMAL HIGH (ref 65–99)
HDL: 39 mg/dL — ABNORMAL LOW (ref >39)
Immature Grans (Abs): 0 10*3/uL (ref 0.0–0.1)
Immature Granulocytes: 0 %
Lymphocytes Absolute: 1.6 10*3/uL (ref 0.7–3.1)
MCH: 30.7 pg (ref 26.6–33.0)
MCHC: 33.7 g/dL (ref 31.5–35.7)
MCV: 91 fL (ref 79–97)
Neutrophils Absolute: 2.3 10*3/uL (ref 1.4–7.0)
Phosphorus: 2.7 mg/dL — ABNORMAL LOW (ref 2.8–4.1)
Platelets: 163 10*3/uL (ref 150–450)
Prostate Specific Ag, Serum: 0.8 ng/mL (ref 0.0–4.0)
RBC: 5.05 x10E6/uL (ref 4.14–5.80)
Sodium: 138 mmol/L (ref 134–144)
T4, Total: 8.5 ug/dL (ref 4.5–12.0)
TSH: 1.83 u[IU]/mL (ref 0.450–4.500)
Total Protein: 7.3 g/dL (ref 6.0–8.5)
Uric Acid: 4 mg/dL (ref 3.8–8.4)
WBC: 4.8 10*3/uL (ref 3.4–10.8)

## 2020-01-21 LAB — CMP12+LP+TP+TSH+6AC+PSA+CBC?
Albumin: 4.3 g/dL (ref 4.0–5.0)
Alkaline Phosphatase: 72 IU/L (ref 39–117)
Chol/HDL Ratio: 4.4 ratio (ref 0.0–5.0)
EOS (ABSOLUTE): 0.3 10*3/uL (ref 0.0–0.4)
Free Thyroxine Index: 2.3 (ref 1.2–4.9)
GFR calc Af Amer: 100 mL/min/{1.73_m2} (ref >59)
Hematocrit: 46 % (ref 37.5–51.0)
Hemoglobin: 15.5 g/dL (ref 13.0–17.7)
Iron: 124 ug/dL (ref 38–169)
LDH: 188 IU/L (ref 121–224)
LDL Chol Calc (NIH): 116 mg/dL — ABNORMAL HIGH (ref 0–99)
Lymphs: 34 %
Monocytes Absolute: 0.6 10*3/uL (ref 0.1–0.9)
Monocytes: 13 %
Neutrophils: 48 %
Potassium: 4.6 mmol/L (ref 3.5–5.2)
RDW: 12.2 % (ref 11.6–15.4)
T3 Uptake Ratio: 27 % (ref 24–39)
Triglycerides: 93 mg/dL (ref 0–149)
VLDL Cholesterol Cal: 17 mg/dL (ref 5–40)

## 2020-01-21 LAB — HGB A1C W/O EAG: Hgb A1c MFr Bld: 9.4 % — ABNORMAL HIGH (ref 4.8–5.6)

## 2020-01-21 LAB — MICROALBUMIN / CREATININE URINE RATIO
Creatinine, Urine: 112.8 mg/dL
Microalb/Creat Ratio: 5 mg/g creat (ref 0–29)
Microalbumin, Urine: 5.8 ug/mL

## 2020-01-22 NOTE — Progress Notes (Signed)
Labs collected on 01/17/2020.  Followed up with Dr. Lady Gary (Cardiology) after last year's EKG & he didn't see any irregularities.  No Tx Wore the monitor for 3 weeks & had an ultrasound.  AMD

## 2020-01-23 ENCOUNTER — Other Ambulatory Visit: Payer: Self-pay

## 2020-01-23 ENCOUNTER — Ambulatory Visit: Payer: Self-pay | Admitting: Physician Assistant

## 2020-01-23 ENCOUNTER — Encounter: Payer: Self-pay | Admitting: Physician Assistant

## 2020-01-23 VITALS — BP 131/87 | HR 80 | Temp 97.2°F | Resp 16 | Ht 72.0 in | Wt 219.0 lb

## 2020-01-23 DIAGNOSIS — I4891 Unspecified atrial fibrillation: Secondary | ICD-10-CM | POA: Insufficient documentation

## 2020-01-23 DIAGNOSIS — J309 Allergic rhinitis, unspecified: Secondary | ICD-10-CM

## 2020-01-23 DIAGNOSIS — Z0000 Encounter for general adult medical examination without abnormal findings: Secondary | ICD-10-CM

## 2020-01-23 MED ORDER — METFORMIN HCL ER 500 MG PO TB24: 1000.0000 mg | ORAL_TABLET | Freq: Two times a day (BID) | ORAL | 3 refills | Status: DC

## 2020-01-23 NOTE — Progress Notes (Signed)
   Subjective: Annual exam    Patient ID: Michael Malone, male    DOB: Nov 06, 1970, 49 y.o.   MRN: [id number]  HPI Patient presents for annual exam.  Patient voices no concerns or complaints.   Review of Systems BPH and diabetes    Objective:   Physical Exam No acute distress.  HEENT is unremarkable.  Neck is supple for adenopathy or bruits.  Lungs are clear to auscultation.  Heart regular rate and rhythm.  Abdomen with negative HSM, normoactive bowel sounds, is soft nontender palpation.  No obvious deformity to the upper or lower extremities.  Patient has full equal range of motion upper lower extremities.  No obvious deformity to cervical lumbar spine.  Patient has full equal range of motion cervical lumbar spine.  Cranial nerves II through XII grossly intact.       Assessment & Plan: Annual exam  Discussed patient labs consistent of hemoglobin A1c of 9.4.  Patient is currently taking 500 mg of Metformin.  Will increase to 1000 mg twice daily.  Patient will follow up in 3 months for another hemoglobin A1c.

## 2020-04-22 ENCOUNTER — Ambulatory Visit: Payer: 59

## 2020-04-30 ENCOUNTER — Encounter: Payer: Self-pay | Admitting: Emergency Medicine

## 2020-04-30 ENCOUNTER — Other Ambulatory Visit: Payer: Self-pay

## 2020-04-30 ENCOUNTER — Ambulatory Visit: Payer: Self-pay | Admitting: Emergency Medicine

## 2020-04-30 VITALS — BP 131/91 | HR 76 | Temp 97.2°F | Resp 12 | Ht 72.0 in | Wt 209.0 lb

## 2020-04-30 DIAGNOSIS — E119 Type 2 diabetes mellitus without complications: Secondary | ICD-10-CM

## 2020-04-30 LAB — POCT GLYCOSYLATED HEMOGLOBIN (HGB A1C): Hemoglobin A1C: 7.3 % — AB (ref 4.0–5.6)

## 2020-04-30 NOTE — Progress Notes (Signed)
10  Lb weight loss since 04.01.21 physical.  AMD

## 2020-04-30 NOTE — Progress Notes (Signed)
Is here for reevaluation as his A1c on his last visit was elevated.  His Metformin was increased to 2 tablets twice daily.  A1c on his physical was 9.4.  Today's A1c is 7.3 and patient has lost 10 pounds since his last visit in April.  Patient will stay the course and continue eating healthier.  We will continue on the Metformin 500 mg 2 twice daily and recheck his A1c before Thanksgiving.  He is encouraged to lose another 10 pounds.

## 2020-08-05 ENCOUNTER — Other Ambulatory Visit: Payer: 59

## 2020-08-11 ENCOUNTER — Ambulatory Visit: Payer: Self-pay

## 2020-12-23 ENCOUNTER — Other Ambulatory Visit: Payer: Self-pay

## 2020-12-23 ENCOUNTER — Ambulatory Visit: Payer: Self-pay | Admitting: Emergency Medicine

## 2020-12-23 ENCOUNTER — Encounter: Payer: Self-pay | Admitting: Emergency Medicine

## 2020-12-23 VITALS — HR 90 | Temp 98.2°F | Resp 14 | Ht 72.0 in | Wt 208.0 lb

## 2020-12-23 DIAGNOSIS — S86111A Strain of other muscle(s) and tendon(s) of posterior muscle group at lower leg level, right leg, initial encounter: Secondary | ICD-10-CM

## 2020-12-23 NOTE — Progress Notes (Signed)
I have reviewed the triage vital signs and the nursing notes.   HISTORY  Chief Complaint right calf pain (Last night pt was walking up stairs hurd a loud pop. It feels tight right now until that muscle stretches by extending the ankle. )  HPI Michael Malone is a 50 y.o. male is here with complaint of right lower leg.  Patient states that he was walking up some stairs last night and heard a loud "pop".  Patient states that the muscle feels tight but he is able to walk on a regular gait without pain.  Patient also today has noticed that he can flex and extend his ankle and foot without any restriction.  He states if he stands on his tiptoes it begins to hurt in his calf area.  He denies any previous tendon injuries.      Past Medical History:  Diagnosis Date  . Allergic rhinitis   . Atrial fibrillation (HCC)   . Diabetes (HCC)   . Kidney stone     Patient Active Problem List   Diagnosis Date Noted  . Allergic rhinitis 01/23/2020  . A-fib (HCC) 01/23/2020  . Type 2 diabetes mellitus (HCC) 10/09/2018    Past Surgical History:  Procedure Laterality Date  . HERNIA REPAIR      Prior to Admission medications   Medication Sig Start Date End Date Taking? Authorizing Provider  loratadine (CLARITIN) 10 MG tablet Take 10 mg by mouth daily.   Yes [provider]  metFORMIN (GLUCOPHAGE-XR) 500 MG 24 hr tablet Take 2 tablets (1,000 mg total) by mouth in the morning and at bedtime. Patient taking differently: Take 1,000 mg by mouth in the morning and at bedtime. Take 2 tabs in am and 2 tabs PM 01/23/20  Yes Joni Reining, PA-C    Allergies Patient has no known allergies.  Family History  Problem Relation Age of Onset  . Kidney disease Neg Hx   . Prostate cancer Neg Hx   . Kidney Stones Father   . Kidney Stones Mother     Social History Social History   Tobacco Use  . Smoking status: Never Smoker  . Smokeless tobacco: Never Used  Substance Use Topics  . Alcohol  use: No    Alcohol/week: 0.0 standard drinks  . Drug use: No    Review of Systems Constitutional: No fever/chills Eyes: No visual changes. Cardiovascular: Denies chest pain. Respiratory: Denies shortness of breath. Musculoskeletal: Positive right calf pain. Skin: Negative for rash. Neurological: Negative for focal weakness or numbness. ____________________________________________   PHYSICAL EXAM:  Constitutional: Alert and oriented. Well appearing and in no acute distress. Eyes: Conjunctivae are normal. PERRL. EOMI. Head: Atraumatic. Neck: No stridor.  Cardiovascular:   Good peripheral circulation. Respiratory: Normal respiratory effort.  No retractions. Musculoskeletal: On examination of the right lower extremity there is no evidence of skin discoloration such as abrasion or ecchymosis.  Patient is able to flex and extend his foot without any difficulty.  There is no divot noted on palpation of the Achilles tendon.  No evidence noted on exam of Achilles tendon rupture.  No edema.  No focal tenderness on exam.  Normal gait was noted. Neurologic:  Normal speech and language. No gross focal neurologic deficits are appreciated. No gait instability. Skin:  Skin is warm, dry and intact. No rash noted. Psychiatric: Mood and affect are normal. Speech and behavior are normal.    PROCEDURES  Procedure(s) performed (including Critical Care):  Procedures ____________________________________________  INITIAL IMPRESSION / ASSESSMENT AND PLAN  Most likely a muscle strain to the right calf.  No evidence today of Achilles rupture tendon.  Patient will begin taking anti-inflammatories and use ice if needed.  If there is not any improvement or worsening of his symptoms he is aware that he will need to have an MRI to evaluate this.  At this time he does not wish to have imaging done.  We will try conservative treatment first and he is to call back and let us know if there is any residual  problems. ____________________________________________   FINAL CLINICAL IMPRESSION(S)  Right lower leg muscle strain.   ED Discharge Orders    None      Note:  This document was prepared using Dragon voice recognition software and may include unintentional dictation errors.

## 2021-01-28 ENCOUNTER — Other Ambulatory Visit: Payer: Self-pay

## 2021-01-28 ENCOUNTER — Ambulatory Visit: Payer: Self-pay

## 2021-01-28 DIAGNOSIS — Z0000 Encounter for general adult medical examination without abnormal findings: Secondary | ICD-10-CM

## 2021-01-28 LAB — POCT URINALYSIS DIPSTICK
Appearance: NORMAL
Bilirubin, UA: NEGATIVE
Blood, UA: 1
Glucose, UA: NEGATIVE
Ketones, UA: NEGATIVE
Leukocytes, UA: NEGATIVE
Nitrite, UA: NEGATIVE
Protein, UA: POSITIVE — AB
Spec Grav, UA: >=1.03 — AB (ref 1.010–1.025)
Urobilinogen, UA: 0.2 E.U./dL
pH, UA: 6 (ref 5.0–8.0)

## 2021-01-28 NOTE — Progress Notes (Signed)
Pt scheduled to complete physical with Durward Parcel, PA-C.  CL,RMA

## 2021-01-30 LAB — MICROALBUMIN / CREATININE URINE RATIO
Creatinine, Urine: 301.3 mg/dL
Microalb/Creat Ratio: 3 mg/g creat (ref 0–29)
Microalbumin, Urine: 10.3 ug/mL

## 2021-01-30 LAB — CMP12+LP+TP+TSH+6AC+PSA+CBC…
ALT: 37 IU/L (ref 0–44)
AST: 22 IU/L (ref 0–40)
Albumin/Globulin Ratio: 1.6 (ref 1.2–2.2)
Albumin: 4.2 g/dL (ref 4.0–5.0)
Alkaline Phosphatase: 62 IU/L (ref 44–121)
BUN/Creatinine Ratio: 12 (ref 9–20)
BUN: 13 mg/dL (ref 6–24)
Basophils Absolute: 0 10*3/uL (ref 0.0–0.2)
Basos: 1 %
Bilirubin Total: 1 mg/dL (ref 0.0–1.2)
Calcium: 9.1 mg/dL (ref 8.7–10.2)
Chloride: 102 mmol/L (ref 96–106)
Chol/HDL Ratio: 4.9 ratio (ref 0.0–5.0)
Cholesterol, Total: 170 mg/dL (ref 100–199)
Creatinine, Ser: 1.09 mg/dL (ref 0.76–1.27)
EOS (ABSOLUTE): 0.4 10*3/uL (ref 0.0–0.4)
Eos: 8 %
Estimated CHD Risk: 1 times avg. (ref 0.0–1.0)
Free Thyroxine Index: 2 (ref 1.2–4.9)
GGT: 32 IU/L (ref 0–65)
Globulin, Total: 2.6 g/dL (ref 1.5–4.5)
Glucose: 159 mg/dL — ABNORMAL HIGH (ref 65–99)
HDL: 35 mg/dL — ABNORMAL LOW (ref >39)
Hematocrit: 43.7 % (ref 37.5–51.0)
Hemoglobin: 15 g/dL (ref 13.0–17.7)
Immature Grans (Abs): 0 10*3/uL (ref 0.0–0.1)
Immature Granulocytes: 0 %
Iron: 159 ug/dL (ref 38–169)
LDH: 151 IU/L (ref 121–224)
LDL Chol Calc (NIH): 116 mg/dL — ABNORMAL HIGH (ref 0–99)
Lymphocytes Absolute: 1.6 10*3/uL (ref 0.7–3.1)
Lymphs: 34 %
MCH: 30.7 pg (ref 26.6–33.0)
MCHC: 34.3 g/dL (ref 31.5–35.7)
MCV: 90 fL (ref 79–97)
Monocytes Absolute: 0.5 10*3/uL (ref 0.1–0.9)
Monocytes: 11 %
Neutrophils Absolute: 2.2 10*3/uL (ref 1.4–7.0)
Neutrophils: 46 %
Phosphorus: 2.4 mg/dL — ABNORMAL LOW (ref 2.8–4.1)
Platelets: 229 10*3/uL (ref 150–450)
Potassium: 4.6 mmol/L (ref 3.5–5.2)
Prostate Specific Ag, Serum: 0.8 ng/mL (ref 0.0–4.0)
RBC: 4.88 x10E6/uL (ref 4.14–5.80)
RDW: 12.4 % (ref 11.6–15.4)
Sodium: 139 mmol/L (ref 134–144)
T3 Uptake Ratio: 26 % (ref 24–39)
T4, Total: 7.8 ug/dL (ref 4.5–12.0)
TSH: 2.38 u[IU]/mL (ref 0.450–4.500)
Total Protein: 6.8 g/dL (ref 6.0–8.5)
Triglycerides: 102 mg/dL (ref 0–149)
Uric Acid: 5 mg/dL (ref 3.8–8.4)
VLDL Cholesterol Cal: 19 mg/dL (ref 5–40)
WBC: 4.7 10*3/uL (ref 3.4–10.8)
eGFR: 84 mL/min/{1.73_m2} (ref >59)

## 2021-01-30 LAB — HGB A1C W/O EAG: Hgb A1c MFr Bld: 7.7 % — ABNORMAL HIGH (ref 4.8–5.6)

## 2021-02-03 ENCOUNTER — Other Ambulatory Visit: Payer: Self-pay

## 2021-02-03 ENCOUNTER — Ambulatory Visit: Payer: Self-pay | Admitting: Physician Assistant

## 2021-02-03 ENCOUNTER — Encounter: Payer: Self-pay | Admitting: Physician Assistant

## 2021-02-03 VITALS — BP 130/82 | HR 85 | Temp 98.2°F | Resp 12 | Ht 72.5 in | Wt 213.0 lb

## 2021-02-03 DIAGNOSIS — Z0000 Encounter for general adult medical examination without abnormal findings: Secondary | ICD-10-CM

## 2021-02-03 NOTE — Progress Notes (Signed)
   Subjective: Annual physical exam    Patient ID: Michael Malone, male    DOB: June 13, 1971, 50 y.o.   MRN: [id number]  HPI Patient presents annual physical exam voices no concerns or complaints.   Review of Systems    Diabetes Objective:   Physical Exam  No acute distress. BP is 130/82, pulse 85, respiration 12, temperature 98.2, patient is 99% O2 sat on room air.  Patient weight is 213 pounds.  HEENT is unremarkable.  Neck is supple adenectomy or bruits.  Lungs are clear to auscultation.  Heart is regular rate and rhythm.  No acute findings on EKG.  Abdomen is negative HSM, normoactive bowel sounds, soft, and nontender to palpation. No obvious deformities to the upper or lower extremities.  Patient has full and equal range of motion of the upper and lower extremities.  No obvious cervical or lumbar spine deformity.  Patient has full and equal range of motion cervical lumbar spine.  Cranial nerves II through XII grossly intact.      Assessment & Plan: Well exam.  Discussed patient lab results.  Patient is gaining good control of his diabetic condition.  Advised to continue with diet and exercise.  Continue medication as directed.  Follow-up as needed.

## 2021-02-11 ENCOUNTER — Other Ambulatory Visit: Payer: Self-pay

## 2021-02-11 DIAGNOSIS — E119 Type 2 diabetes mellitus without complications: Secondary | ICD-10-CM

## 2021-02-11 MED ORDER — METFORMIN HCL ER 500 MG PO TB24: 1000.0000 mg | ORAL_TABLET | Freq: Two times a day (BID) | ORAL | 3 refills | Status: DC

## 2021-04-09 ENCOUNTER — Telehealth: Payer: Self-pay

## 2021-04-09 NOTE — Telephone Encounter (Signed)
Ter review with provider Blima Ledger, due to DMII status, plan to offer Pt paxlovid. Call placed to [facility] who states based on how he is feeling, he declines this medication at this time. He will call the clinic if symptoms should persist or worsen.

## 2021-04-09 NOTE — Telephone Encounter (Signed)
Michael Malone emailed to report as of 04/08/21 experiencing symptoms of fatigue, sore throat, mild headache and congestion. He tested positive on rapid at home test  last night. [name] reports sputum production about every hour of dime size or less, clear to white sputum. He is currently taking Fast Max cold Severe Cold and has been encouraged to drink plenty of fluids, rest and call clinic if he should experience complications or feel worse. Sent to provider for review of recommendations.

## 2022-03-22 ENCOUNTER — Ambulatory Visit: Payer: Self-pay

## 2022-03-22 DIAGNOSIS — Z0000 Encounter for general adult medical examination without abnormal findings: Secondary | ICD-10-CM

## 2022-03-22 LAB — POCT URINALYSIS DIPSTICK
Bilirubin, UA: NEGATIVE
Blood, UA: NEGATIVE
Glucose, UA: POSITIVE — AB
Ketones, UA: NEGATIVE
Leukocytes, UA: NEGATIVE
Nitrite, UA: NEGATIVE
Protein, UA: NEGATIVE
Spec Grav, UA: >=1.03 — AB (ref 1.010–1.025)
Urobilinogen, UA: 0.2 E.U./dL
pH, UA: 5 (ref 5.0–8.0)

## 2022-03-22 NOTE — Progress Notes (Signed)
Pt presents today for physical labs, will return to clinic for scheduled physical.  

## 2022-03-23 LAB — CMP12+LP+TP+TSH+6AC+PSA+CBC…
ALT: 33 IU/L (ref 0–44)
AST: 19 IU/L (ref 0–40)
Albumin/Globulin Ratio: 1.6 (ref 1.2–2.2)
Albumin: 4.5 g/dL (ref 4.0–5.0)
Alkaline Phosphatase: 73 IU/L (ref 44–121)
BUN/Creatinine Ratio: 11 (ref 9–20)
BUN: 11 mg/dL (ref 6–24)
Basophils Absolute: 0 10*3/uL (ref 0.0–0.2)
Basos: 1 %
Bilirubin Total: 0.8 mg/dL (ref 0.0–1.2)
Calcium: 9.1 mg/dL (ref 8.7–10.2)
Chloride: 99 mmol/L (ref 96–106)
Chol/HDL Ratio: 5.1 ratio — ABNORMAL HIGH (ref 0.0–5.0)
Cholesterol, Total: 188 mg/dL (ref 100–199)
Creatinine, Ser: 1 mg/dL (ref 0.76–1.27)
EOS (ABSOLUTE): 0.3 10*3/uL (ref 0.0–0.4)
Eos: 6 %
Estimated CHD Risk: 1.1 times avg. — ABNORMAL HIGH (ref 0.0–1.0)
Free Thyroxine Index: 2.2 (ref 1.2–4.9)
GGT: 39 IU/L (ref 0–65)
Globulin, Total: 2.8 g/dL (ref 1.5–4.5)
Glucose: 219 mg/dL — ABNORMAL HIGH (ref 70–99)
HDL: 37 mg/dL — ABNORMAL LOW (ref >39)
Hematocrit: 48.7 % (ref 37.5–51.0)
Hemoglobin: 16.6 g/dL (ref 13.0–17.7)
Immature Grans (Abs): 0 10*3/uL (ref 0.0–0.1)
Immature Granulocytes: 0 %
Iron: 137 ug/dL (ref 38–169)
LDH: 162 IU/L (ref 121–224)
LDL Chol Calc (NIH): 129 mg/dL — ABNORMAL HIGH (ref 0–99)
Lymphocytes Absolute: 2 10*3/uL (ref 0.7–3.1)
Lymphs: 35 %
MCH: 30.7 pg (ref 26.6–33.0)
MCHC: 34.1 g/dL (ref 31.5–35.7)
MCV: 90 fL (ref 79–97)
Monocytes Absolute: 0.6 10*3/uL (ref 0.1–0.9)
Monocytes: 10 %
Neutrophils Absolute: 2.7 10*3/uL (ref 1.4–7.0)
Neutrophils: 48 %
Phosphorus: 2.6 mg/dL — ABNORMAL LOW (ref 2.8–4.1)
Platelets: 237 10*3/uL (ref 150–450)
Potassium: 4.5 mmol/L (ref 3.5–5.2)
Prostate Specific Ag, Serum: 0.8 ng/mL (ref 0.0–4.0)
RBC: 5.41 x10E6/uL (ref 4.14–5.80)
RDW: 12.2 % (ref 11.6–15.4)
Sodium: 139 mmol/L (ref 134–144)
T3 Uptake Ratio: 27 % (ref 24–39)
T4, Total: 8 ug/dL (ref 4.5–12.0)
TSH: 3.06 u[IU]/mL (ref 0.450–4.500)
Total Protein: 7.3 g/dL (ref 6.0–8.5)
Triglycerides: 121 mg/dL (ref 0–149)
Uric Acid: 3.4 mg/dL — ABNORMAL LOW (ref 3.8–8.4)
VLDL Cholesterol Cal: 22 mg/dL (ref 5–40)
WBC: 5.7 10*3/uL (ref 3.4–10.8)
eGFR: 92 mL/min/{1.73_m2} (ref >59)

## 2022-03-23 LAB — HGB A1C W/O EAG: Hgb A1c MFr Bld: 9 % — ABNORMAL HIGH (ref 4.8–5.6)

## 2022-03-23 LAB — MICROALBUMIN / CREATININE URINE RATIO
Creatinine, Urine: 193.1 mg/dL
Microalb/Creat Ratio: 4 mg/g creat (ref 0–29)
Microalbumin, Urine: 7.5 ug/mL

## 2022-03-29 ENCOUNTER — Ambulatory Visit: Payer: Self-pay | Admitting: Physician Assistant

## 2022-03-29 ENCOUNTER — Encounter: Payer: Self-pay | Admitting: Physician Assistant

## 2022-03-29 VITALS — BP 135/86 | HR 73 | Temp 97.7°F | Resp 12 | Ht 72.5 in | Wt 204.0 lb

## 2022-03-29 DIAGNOSIS — Z0000 Encounter for general adult medical examination without abnormal findings: Secondary | ICD-10-CM

## 2022-03-29 DIAGNOSIS — E119 Type 2 diabetes mellitus without complications: Secondary | ICD-10-CM

## 2022-03-29 LAB — POCT GLYCOSYLATED HEMOGLOBIN (HGB A1C): Hemoglobin A1C: 8.3 % — AB (ref 4.0–5.6)

## 2022-03-29 NOTE — Progress Notes (Signed)
Pt concerned of metformin wants to discuss other options or lower dosage.

## 2022-03-29 NOTE — Progress Notes (Signed)
[facility]  ____________________________________________   None    (approximate)  I have reviewed the triage vital signs and the nursing notes.   HISTORY  Chief Complaint Annual Exam    HPI Michael Malone is a 51 y.o. male patient presents annual physical exam.  Patient is noncompliance of medication for diabetes.  Patient is currently taking 1000 mg of metformin twice daily.  Patient states not taking medicine approximately 4 to 6 weeks ago secondary to increased diarrhea.  Patient is aware of the rise of his hemoglobin A1c secondary to cessation of medication.  Patient denies any other concerns or complaints.  Past medical history most concerning for A-fib.  Patient was evaluated by cardiologist stated that the patient does not have A-fib but a sinus arrhythmia.  Patient was further evaluated with echocardiogram with no acute  findings.         Past Medical History:  Diagnosis Date   Allergic rhinitis    Atrial fibrillation ([place])    Diabetes ([place])    Kidney stone     Patient Active Problem List   Diagnosis Date Noted   Allergic rhinitis 01/23/2020   A-fib ([place]) 01/23/2020   Type 2 diabetes mellitus ([place]) 10/09/2018    Past Surgical History:  Procedure Laterality Date   HERNIA REPAIR      Prior to Admission medications   Medication Sig Start Date End Date Taking? Authorizing Provider  loratadine (CLARITIN) 10 MG tablet Take 10 mg by mouth daily.    [provider]  metFORMIN (GLUCOPHAGE-XR) 500 MG 24 hr tablet Take 2 tablets (1,000 mg total) by mouth in the morning and at bedtime. 02/11/21   Sable Feil, PA-C    Allergies Patient has no known allergies.  Family History  Problem Relation Age of Onset   Kidney disease Neg Hx    Prostate cancer Neg Hx    Kidney Stones Father    Kidney Stones Mother     Social History Social History   Tobacco Use   Smoking status: Never   Smokeless tobacco: Never   Substance Use Topics   Alcohol use: No    Alcohol/week: 0.0 standard drinks   Drug use: No    Review of Systems Constitutional: No fever/chills Eyes: No visual changes. ENT: No sore throat. Cardiovascular: Denies chest pain. Respiratory: Denies shortness of breath. Gastrointestinal: No abdominal pain.  No nausea, no vomiting.  No diarrhea.  No constipation. Genitourinary: Negative for dysuria. Musculoskeletal: Negative for back pain. Skin: Negative for rash. Neurological: Negative for headaches, focal weakness or numbness. Endocrine: Diabetes  ____________________________________________   PHYSICAL EXAM:  VITAL SIGNS: BP is 135/86, pulse 73, respiration 12, temperature 97.7, and patient 1% O2 sat on room air.  Patient weighs 204 pounds and BMI is 27.29. Constitutional: Alert and oriented. Well appearing and in no acute distress. Eyes: Conjunctivae are normal. PERRL. EOMI. Head: Atraumatic. Nose: No congestion/rhinnorhea. Mouth/Throat: Mucous membranes are moist.  Oropharynx non-erythematous. Neck: No stridor.  No cervical spine tenderness to palpation. Hematological/Lymphatic/Immunilogical: No cervical lymphadenopathy. Cardiovascular: Normal rate, regular rhythm. Grossly normal heart sounds.  Good peripheral circulation. Respiratory: Normal respiratory effort.  No retractions. Lungs CTAB. Gastrointestinal: Soft and nontender. No distention. No abdominal bruits. No CVA tenderness. Genitourinary: Deferred Musculoskeletal: No lower extremity tenderness nor edema.  No joint effusions. Neurologic:  Normal speech and language. No gross focal neurologic deficits are appreciated. No gait instability. Skin:  Skin is warm, dry and intact. No rash noted.  Psychiatric: Mood and affect are normal. Speech and behavior are normal.  ____________________________________________   LABS       Component Ref Range & Units 7 d ago 1 yr ago 2 yr ago  Glucose 70 - 99 mg/dL 219 High   159 High   R  202 High  R   Uric Acid 3.8 - 8.4 mg/dL 3.4 Low   5.0 CM  4.0 CM   Comment:            Therapeutic target for gout patients: <6.0  BUN 6 - 24 mg/dL '11  13  12   ' Creatinine, Ser 0.76 - 1.27 mg/dL 1.00  1.09  1.03   eGFR >59 mL/min/1.73 92  84    BUN/Creatinine Ratio 9 - '20 11  12  12   ' Sodium 134 - 144 mmol/L 139  139  138   Potassium 3.5 - 5.2 mmol/L 4.5  4.6  4.6   Chloride 96 - 106 mmol/L 99  102  102   Calcium 8.7 - 10.2 mg/dL 9.1  9.1  9.1   Phosphorus 2.8 - 4.1 mg/dL 2.6 Low   2.4 Low   2.7 Low    Total Protein 6.0 - 8.5 g/dL 7.3  6.8  7.3   Albumin 4.0 - 5.0 g/dL 4.5  4.2  4.3   Globulin, Total 1.5 - 4.5 g/dL 2.8  2.6  3.0   Albumin/Globulin Ratio 1.2 - 2.2 1.6  1.6  1.4   Bilirubin Total 0.0 - 1.2 mg/dL 0.8  1.0  0.8   Alkaline Phosphatase 44 - 121 IU/L 73  62  72 R   LDH 121 - 224 IU/L 162  151  188   AST 0 - 40 IU/L '19  22  27   ' ALT 0 - 44 IU/L 33  37  38   GGT 0 - 65 IU/L 39  32  44   Iron 38 - 169 ug/dL 137  159  124   Cholesterol, Total 100 - 199 mg/dL 188  170  172   Triglycerides 0 - 149 mg/dL 121  102  93   HDL >39 mg/dL 37 Low   35 Low   39 Low    VLDL Cholesterol Cal 5 - 40 mg/dL '22  19  17   ' LDL Chol Calc (NIH) 0 - 99 mg/dL 129 High   116 High   116 High    Chol/HDL Ratio 0.0 - 5.0 ratio 5.1 High   4.9 CM  4.4 CM   Comment:                                   T. Chol/HDL Ratio                                              Men  Women                                1/2 Avg.Risk  3.4    3.3                                    Avg.Risk  5.0  4.4                                 2X Avg.Risk  9.6    7.1                                 3X Avg.Risk 23.4   11.0   Estimated CHD Risk 0.0 - 1.0 times avg. 1.1 High   1.0 CM  0.9 CM   Comment: The CHD Risk is based on the T. Chol/HDL ratio. Other  factors affect CHD Risk such as hypertension, smoking,  diabetes, severe obesity, and family history of  premature CHD.   TSH 0.450 - 4.500 uIU/mL 3.060  2.380  1.830   T4,  Total 4.5 - 12.0 ug/dL 8.0  7.8  8.5   T3 Uptake Ratio 24 - 39 % '27  26  27   ' Free Thyroxine Index 1.2 - 4.9 2.2  2.0  2.3   Prostate Specific Ag, Serum 0.0 - 4.0 ng/mL 0.8  0.8 CM  0.8 CM   Comment: Roche ECLIA methodology.  According to the American Urological Association, Serum PSA should  decrease and remain at undetectable levels after radical  prostatectomy. The AUA defines biochemical recurrence as an initial  PSA value 0.2 ng/mL or greater followed by a subsequent confirmatory  PSA value 0.2 ng/mL or greater.  Values obtained with different assay methods or kits cannot be used  interchangeably. Results cannot be interpreted as absolute evidence  of the presence or absence of malignant disease.   WBC 3.4 - 10.8 x10E3/uL 5.7  4.7  4.8   RBC 4.14 - 5.80 x10E6/uL 5.41  4.88  5.05   Hemoglobin 13.0 - 17.7 g/dL 16.6  15.0  15.5   Hematocrit 37.5 - 51.0 % 48.7  43.7  46.0   MCV 79 - 97 fL 90  90  91   MCH 26.6 - 33.0 pg 30.7  30.7  30.7   MCHC 31.5 - 35.7 g/dL 34.1  34.3  33.7   RDW 11.6 - 15.4 % 12.2  12.4  12.2   Platelets 150 - 450 x10E3/uL 237  229  163   Neutrophils Not Estab. % 48  46  48   Lymphs Not Estab. % 35  34  34   Monocytes Not Estab. % '10  11  13   ' Eos Not Estab. % '6  8  5   ' Basos Not Estab. % 1  1  0   Neutrophils Absolute 1.4 - 7.0 x10E3/uL 2.7  2.2  2.3   Lymphocytes Absolute 0.7 - 3.1 x10E3/uL 2.0  1.6  1.6   Monocytes Absolute 0.1 - 0.9 x10E3/uL 0.6  0.5  0.6   EOS (ABSOLUTE) 0.0 - 0.4 x10E3/uL 0.3  0.4  0.3   Basophils Absolute 0.0 - 0.2 x10E3/uL 0.0  0.0  0.0   Immature Granulocytes Not Estab. % 0  0  0   Immature Grans          ____________________________________________  EKG Sinus  Rhythm at 83 bpm Low voltage in limb leads.   -RSR(V1) -nondiagnostic.   ABNORMAL   ____________________________________________    ____________________________________________   INITIAL IMPRESSION / ASSESSMENT AND PLAN  As part of my medical decision making,  I reviewed the following data within the [facility]  Discussed labs and EKG findings with patient.  Patient is amenable to restarting metformin.  Patient was given a prescription for 850 mg to take twice daily for follow-up in 3 months for hemoglobin A1c.  If refractory will consult endocrinologist for definitive evaluation and treatment.        ____________________________________________   FINAL CLINICAL IMPRESSION(S) / ED DIAGNOSES  Well exam   ED Discharge Orders     None        Note:  This document was prepared using Dragon voice recognition software and may include unintentional dictation errors.

## 2022-03-31 ENCOUNTER — Other Ambulatory Visit: Payer: Self-pay | Admitting: Physician Assistant

## 2022-03-31 MED ORDER — METFORMIN HCL 850 MG PO TABS: 850.0000 mg | ORAL_TABLET | Freq: Two times a day (BID) | ORAL | 2 refills | Status: DC

## 2022-06-30 ENCOUNTER — Other Ambulatory Visit: Payer: Self-pay

## 2022-06-30 DIAGNOSIS — E119 Type 2 diabetes mellitus without complications: Secondary | ICD-10-CM

## 2022-06-30 NOTE — Progress Notes (Signed)
A1C drawn as ordered without difficulty. 

## 2022-07-01 LAB — HGB A1C W/O EAG: Hgb A1c MFr Bld: 8.7 % — ABNORMAL HIGH (ref 4.8–5.6)

## 2022-07-07 ENCOUNTER — Ambulatory Visit: Payer: Self-pay | Admitting: Physician Assistant

## 2022-07-07 ENCOUNTER — Encounter: Payer: Self-pay | Admitting: Physician Assistant

## 2022-07-07 VITALS — BP 120/90 | HR 75 | Temp 97.3°F | Resp 12 | Wt 212.0 lb

## 2022-07-07 DIAGNOSIS — E119 Type 2 diabetes mellitus without complications: Secondary | ICD-10-CM

## 2022-07-07 MED ORDER — METFORMIN HCL 1000 MG PO TABS: 1000.0000 mg | ORAL_TABLET | Freq: Two times a day (BID) | ORAL | 3 refills | Status: AC

## 2022-07-07 NOTE — Progress Notes (Signed)
   Subjective: Diabetes    Patient ID: Michael Malone, male    DOB: 1970/11/19, 51 y.o.   MRN: [id number]  HPI Patient is here today for 90-month follow-up status post starting metformin at 850 mg twice daily.  3 months ago patient hemoglobin A1c was 9.0.  1 week ago hemoglobin A1c was recorded at 8.7.  Patient states taking medication as directed.  States no side effects and taking medications.   Review of Systems Review of systems is remarkable for A-fib and type 2 diabetes.    Objective:   Physical Exam BP is 140/85, pulse 75, respiration 12, temperature 97.3, patient 90% O2 sat on room air.  Patient weighs 212 pounds and BMI is 28.36.       Assessment & Plan: Type 2 diabetes   We will increase patient metformin to 1000 mg twice daily and follow-up hemoglobin A1c in 3 months.  Patient advised return back sooner if condition worsens or intolerance of increased dosage.

## 2022-10-06 ENCOUNTER — Other Ambulatory Visit: Payer: Self-pay

## 2022-10-06 DIAGNOSIS — E119 Type 2 diabetes mellitus without complications: Secondary | ICD-10-CM

## 2022-10-06 NOTE — Progress Notes (Signed)
Lab drawn as ordered for A1C.

## 2022-10-07 LAB — HGB A1C W/O EAG: Hgb A1c MFr Bld: 7.7 % — ABNORMAL HIGH (ref 4.8–5.6)

## 2022-10-12 ENCOUNTER — Ambulatory Visit: Payer: Self-pay | Admitting: Physician Assistant

## 2022-10-12 ENCOUNTER — Encounter: Payer: Self-pay | Admitting: Physician Assistant

## 2022-10-12 VITALS — BP 145/97 | HR 89 | Temp 97.3°F | Resp 12

## 2022-10-12 DIAGNOSIS — E1100 Type 2 diabetes mellitus with hyperosmolarity without nonketotic hyperglycemic-hyperosmolar coma (NKHHC): Secondary | ICD-10-CM

## 2022-10-12 NOTE — Progress Notes (Signed)
   Subjective: Diabetes    Patient ID: Michael Malone, male    DOB: [birth year], 51 y.o.   MRN: [id number]  HPI Patient is here for follow-up for diabetes.  Patient last hemoglobin was 8.7.  Patient metformin increased to 1000 mg twice daily.  Denies any side effects with the increase in medication.   Review of Systems A-fib and allergic rhinitis    Objective:   Physical Exam  BP is 145/97, pulse 89, respiration 12, temperature 97.3, patient 99% O2 sat on room air. Patient hemoglobin A1c is 7.7.      Assessment & Plan: Diabetes   Encourage patient to continue medications and dietary changes.  Will follow-up again in 3 months.

## 2023-01-10 ENCOUNTER — Other Ambulatory Visit: Payer: Self-pay

## 2023-01-10 VITALS — BP 158/91 | HR 83 | Resp 12

## 2023-01-10 DIAGNOSIS — E1100 Type 2 diabetes mellitus with hyperosmolarity without nonketotic hyperglycemic-hyperosmolar coma (NKHHC): Secondary | ICD-10-CM

## 2023-01-10 NOTE — Progress Notes (Signed)
Pt completed A1c lab. 

## 2023-01-11 LAB — HGB A1C W/O EAG: Hgb A1c MFr Bld: 8 % — ABNORMAL HIGH (ref 4.8–5.6)

## 2023-01-17 ENCOUNTER — Ambulatory Visit: Payer: Self-pay | Admitting: Physician Assistant

## 2023-01-17 ENCOUNTER — Encounter: Payer: Self-pay | Admitting: Physician Assistant

## 2023-01-17 VITALS — BP 142/87 | HR 76 | Temp 97.1°F | Resp 12 | Ht 72.5 in | Wt 209.0 lb

## 2023-01-17 DIAGNOSIS — E1100 Type 2 diabetes mellitus with hyperosmolarity without nonketotic hyperglycemic-hyperosmolar coma (NKHHC): Secondary | ICD-10-CM

## 2023-01-17 MED ORDER — EMPAGLIFLOZIN 25 MG PO TABS: 25.0000 mg | ORAL_TABLET | Freq: Every day | ORAL | 0 refills | Status: DC

## 2023-01-17 NOTE — Progress Notes (Signed)
   Subjective: Diabetes    Patient ID: Michael Malone, male    DOB: 1970-12-21, 52 y.o.   MRN: [id number]  HPI Patient today for treatment follow-up type 2 diabetes.  Last visit patient metformin was increased to 1000 mg twice daily.  His hemoglobin A1c at that time 7.7.  Patient was advised on lifestyle changes and diet.  Patient is lost 3 pounds since last visit.  However patient A1c has increased to 8.0.  Patient denies compliance with medication.   Review of Systems Negative septa above complaint    Objective:   Physical Exam  BP is 142/87, pulse 76, respiration 12, temperature 97.1, patient 99% O2 sat on room air.  Patient weighs 209 pounds which is a 3 pound decrease from last visit. Rest of exam was deferred.      Assessment & Plan: Diabetes  Patient advised continue metformin 1000 mg twice daily.  Patient given prescription for Jardiance 25 mg.  Patient will follow-up in 3 months.

## 2023-03-28 ENCOUNTER — Ambulatory Visit: Payer: Self-pay

## 2023-03-28 DIAGNOSIS — Z0000 Encounter for general adult medical examination without abnormal findings: Secondary | ICD-10-CM

## 2023-03-28 LAB — POCT URINALYSIS DIPSTICK
Bilirubin, UA: NEGATIVE
Glucose, UA: POSITIVE — AB
Ketones, UA: NEGATIVE
Leukocytes, UA: NEGATIVE
Nitrite, UA: NEGATIVE
Protein, UA: NEGATIVE
Spec Grav, UA: 1.02 (ref 1.010–1.025)
Urobilinogen, UA: 0.2 E.U./dL
pH, UA: 5.5 (ref 5.0–8.0)

## 2023-03-28 NOTE — Progress Notes (Signed)
Pt completed labs for annual physical. 

## 2023-03-29 LAB — CMP12+LP+TP+TSH+6AC+PSA+CBC…
ALT: 31 IU/L (ref 0–44)
AST: 19 IU/L (ref 0–40)
Albumin/Globulin Ratio: 1.8 (ref 1.2–2.2)
Albumin: 4.8 g/dL (ref 4.1–5.1)
Alkaline Phosphatase: 65 IU/L (ref 44–121)
BUN/Creatinine Ratio: 17 (ref 9–20)
BUN: 19 mg/dL (ref 6–24)
Basophils Absolute: 0 10*3/uL (ref 0.0–0.2)
Basos: 0 %
Bilirubin Total: 0.5 mg/dL (ref 0.0–1.2)
Calcium: 9.7 mg/dL (ref 8.7–10.2)
Chloride: 99 mmol/L (ref 96–106)
Chol/HDL Ratio: 4.7 ratio (ref 0.0–5.0)
Cholesterol, Total: 202 mg/dL — ABNORMAL HIGH (ref 100–199)
Creatinine, Ser: 1.1 mg/dL (ref 0.76–1.27)
EOS (ABSOLUTE): 0.3 10*3/uL (ref 0.0–0.4)
Eos: 4 %
Estimated CHD Risk: 0.9 times avg. (ref 0.0–1.0)
Free Thyroxine Index: 2.4 (ref 1.2–4.9)
GGT: 32 IU/L (ref 0–65)
Globulin, Total: 2.6 g/dL (ref 1.5–4.5)
Glucose: 137 mg/dL — ABNORMAL HIGH (ref 70–99)
HDL: 43 mg/dL (ref >39)
Hematocrit: 47.5 % (ref 37.5–51.0)
Hemoglobin: 15.9 g/dL (ref 13.0–17.7)
Immature Grans (Abs): 0 10*3/uL (ref 0.0–0.1)
Immature Granulocytes: 0 %
Iron: 71 ug/dL (ref 38–169)
LDH: 165 IU/L (ref 121–224)
LDL Chol Calc (NIH): 140 mg/dL — ABNORMAL HIGH (ref 0–99)
Lymphocytes Absolute: 2 10*3/uL (ref 0.7–3.1)
Lymphs: 29 %
MCH: 30.3 pg (ref 26.6–33.0)
MCHC: 33.5 g/dL (ref 31.5–35.7)
MCV: 91 fL (ref 79–97)
Monocytes Absolute: 0.7 10*3/uL (ref 0.1–0.9)
Monocytes: 10 %
Neutrophils Absolute: 3.9 10*3/uL (ref 1.4–7.0)
Neutrophils: 57 %
Phosphorus: 3.6 mg/dL (ref 2.8–4.1)
Platelets: 231 10*3/uL (ref 150–450)
Potassium: 4.4 mmol/L (ref 3.5–5.2)
Prostate Specific Ag, Serum: 0.8 ng/mL (ref 0.0–4.0)
RBC: 5.25 x10E6/uL (ref 4.14–5.80)
RDW: 12.1 % (ref 11.6–15.4)
Sodium: 139 mmol/L (ref 134–144)
T3 Uptake Ratio: 28 % (ref 24–39)
T4, Total: 8.5 ug/dL (ref 4.5–12.0)
TSH: 3.14 u[IU]/mL (ref 0.450–4.500)
Total Protein: 7.4 g/dL (ref 6.0–8.5)
Triglycerides: 103 mg/dL (ref 0–149)
Uric Acid: 4.7 mg/dL (ref 3.8–8.4)
VLDL Cholesterol Cal: 19 mg/dL (ref 5–40)
WBC: 7 10*3/uL (ref 3.4–10.8)
eGFR: 82 mL/min/{1.73_m2} (ref >59)

## 2023-03-29 LAB — HGB A1C W/O EAG: Hgb A1c MFr Bld: 7.7 % — ABNORMAL HIGH (ref 4.8–5.6)

## 2023-03-29 LAB — MICROALBUMIN / CREATININE URINE RATIO
Creatinine, Urine: 117.9 mg/dL
Microalb/Creat Ratio: 4 mg/g creat (ref 0–29)
Microalbumin, Urine: 5.3 ug/mL

## 2023-04-05 ENCOUNTER — Ambulatory Visit: Payer: Self-pay | Admitting: Family

## 2023-04-05 ENCOUNTER — Other Ambulatory Visit: Payer: Self-pay

## 2023-04-05 VITALS — BP 130/90 | HR 73 | Temp 97.8°F | Resp 14 | Ht 72.0 in | Wt 200.0 lb

## 2023-04-05 DIAGNOSIS — Z0000 Encounter for general adult medical examination without abnormal findings: Secondary | ICD-10-CM

## 2023-04-05 DIAGNOSIS — Z0001 Encounter for general adult medical examination with abnormal findings: Secondary | ICD-10-CM

## 2023-04-05 DIAGNOSIS — E119 Type 2 diabetes mellitus without complications: Secondary | ICD-10-CM

## 2023-04-05 MED ORDER — EMPAGLIFLOZIN 25 MG PO TABS
25.0000 mg | ORAL_TABLET | Freq: Every day | ORAL | 0 refills | Status: DC
Start: 2023-04-05 — End: 2023-06-06

## 2023-04-05 NOTE — Progress Notes (Signed)
  Subjective:     Patient ID: Michael Malone, male   DOB: [birth year], 52 y.o.   MRN: [id number]  Patient arrive today for his annual well visit.      Review of Systems  Constitutional: Negative.   HENT: Negative.    Eyes: Negative.   Respiratory: Negative.    Cardiovascular: Negative.   Gastrointestinal: Negative.   Endocrine: Negative.   Genitourinary: Negative.   Musculoskeletal: Negative.   Skin: Negative.   Allergic/Immunologic: Negative.   Neurological: Negative.   Hematological: Negative.   Psychiatric/Behavioral: Negative.         Objective:   Physical Exam Vitals reviewed.  Constitutional:      Appearance: Normal appearance.  HENT:     Head: Normocephalic and atraumatic.     Nose: Nose normal.     Mouth/Throat:     Mouth: Mucous membranes are moist.  Eyes:     Pupils: Pupils are equal, round, and reactive to light.  Cardiovascular:     Rate and Rhythm: Normal rate and regular rhythm.     Pulses: Normal pulses.     Heart sounds: Normal heart sounds.  Pulmonary:     Effort: Pulmonary effort is normal.     Breath sounds: Normal breath sounds.  Abdominal:     Palpations: Abdomen is soft.  Musculoskeletal:        General: Normal range of motion.     Cervical back: Normal range of motion.  Skin:    General: Skin is warm and dry.  Neurological:     General: No focal deficit present.     Mental Status: He is alert.  Psychiatric:        Mood and Affect: Mood normal.        Behavior: Behavior normal.        Assessment:     Recently started Jardiance. No negative side effects. A1C decreased from 8.0 to 7.7. Recently he started Prilosec for GERD symptoms.      Plan:     RTC in 3 months for repeat labs.

## 2023-04-05 NOTE — Progress Notes (Signed)
Pt presents today to complete physical, Pt denies any issues or concerns at this time/CL,RMA 

## 2023-04-05 NOTE — Patient Instructions (Signed)
Health Maintenance, Male Adopting a healthy lifestyle and getting preventive care are important in promoting health and wellness. Ask your health care provider about: The right schedule for you to have regular tests and exams. Things you can do on your own to prevent diseases and keep yourself healthy. What should I know about diet, weight, and exercise? Eat a healthy diet  Eat a diet that includes plenty of vegetables, fruits, low-fat dairy products, and lean protein. Do not eat a lot of foods that are high in solid fats, added sugars, or sodium. Maintain a healthy weight Body mass index (BMI) is a measurement that can be used to identify possible weight problems. It estimates body fat based on height and weight. Your health care provider can help determine your BMI and help you achieve or maintain a healthy weight. Get regular exercise Get regular exercise. This is one of the most important things you can do for your health. Most adults should: Exercise for at least 150 minutes each week. The exercise should increase your heart rate and make you sweat (moderate-intensity exercise). Do strengthening exercises at least twice a week. This is in addition to the moderate-intensity exercise. Spend less time sitting. Even light physical activity can be beneficial. Watch cholesterol and blood lipids Have your blood tested for lipids and cholesterol at 51 years of age, then have this test every 5 years. You may need to have your cholesterol levels checked more often if: Your lipid or cholesterol levels are high. You are older than 51 years of age. You are at high risk for heart disease. What should I know about cancer screening? Many types of cancers can be detected early and may often be prevented. Depending on your health history and family history, you may need to have cancer screening at various ages. This may include screening for: Colorectal cancer. Prostate cancer. Skin cancer. Lung  cancer. What should I know about heart disease, diabetes, and high blood pressure? Blood pressure and heart disease High blood pressure causes heart disease and increases the risk of stroke. This is more likely to develop in people who have high blood pressure readings or are overweight. Talk with your health care provider about your target blood pressure readings. Have your blood pressure checked: Every 3-5 years if you are 18-39 years of age. Every year if you are 40 years old or older. If you are between the ages of 65 and 75 and are a current or former smoker, ask your health care provider if you should have a one-time screening for abdominal aortic aneurysm (AAA). Diabetes Have regular diabetes screenings. This checks your fasting blood sugar level. Have the screening done: Once every three years after age 45 if you are at a normal weight and have a low risk for diabetes. More often and at a younger age if you are overweight or have a high risk for diabetes. What should I know about preventing infection? Hepatitis B If you have a higher risk for hepatitis B, you should be screened for this virus. Talk with your health care provider to find out if you are at risk for hepatitis B infection. Hepatitis C Blood testing is recommended for: Everyone born from 1945 through 1965. Anyone with known risk factors for hepatitis C. Sexually transmitted infections (STIs) You should be screened each year for STIs, including gonorrhea and chlamydia, if: You are sexually active and are younger than 51 years of age. You are older than 51 years of age and your   health care provider tells you that you are at risk for this type of infection. Your sexual activity has changed since you were last screened, and you are at increased risk for chlamydia or gonorrhea. Ask your health care provider if you are at risk. Ask your health care provider about whether you are at high risk for HIV. Your health care provider  may recommend a prescription medicine to help prevent HIV infection. If you choose to take medicine to prevent HIV, you should first get tested for HIV. You should then be tested every 3 months for as long as you are taking the medicine. Follow these instructions at home: Alcohol use Do not drink alcohol if your health care provider tells you not to drink. If you drink alcohol: Limit how much you have to 0-2 drinks a day. Know how much alcohol is in your drink. In the [place] one drink equals one 12 oz bottle of beer (355 mL), one 5 oz glass of wine (148 mL), or one 1 oz glass of hard liquor (44 mL). Lifestyle Do not use any products that contain nicotine or tobacco. These products include cigarettes, chewing tobacco, and vaping devices, such as e-cigarettes. If you need help quitting, ask your health care provider. Do not use street drugs. Do not share needles. Ask your health care provider for help if you need support or information about quitting drugs. General instructions Schedule regular health, dental, and eye exams. Stay current with your vaccines. Tell your health care provider if: You often feel depressed. You have ever been abused or do not feel safe at home. Summary Adopting a healthy lifestyle and getting preventive care are important in promoting health and wellness. Follow your health care provider's instructions about healthy diet, exercising, and getting tested or screened for diseases. Follow your health care provider's instructions on monitoring your cholesterol and blood pressure. This information is not intended to replace advice given to you by your health care provider. Make sure you discuss any questions you have with your health care provider. Document Revised: 03/01/2021 Document Reviewed: 03/01/2021 Elsevier Patient Education  2024 [employer]  

## 2023-05-25 ENCOUNTER — Other Ambulatory Visit: Payer: Self-pay

## 2023-05-25 DIAGNOSIS — E119 Type 2 diabetes mellitus without complications: Secondary | ICD-10-CM

## 2023-06-06 ENCOUNTER — Ambulatory Visit: Payer: Self-pay | Admitting: Physician Assistant

## 2023-06-06 ENCOUNTER — Other Ambulatory Visit: Payer: Self-pay | Admitting: Physician Assistant

## 2023-06-06 ENCOUNTER — Encounter: Payer: Self-pay | Admitting: Physician Assistant

## 2023-06-06 DIAGNOSIS — E1100 Type 2 diabetes mellitus with hyperosmolarity without nonketotic hyperglycemic-hyperosmolar coma (NKHHC): Secondary | ICD-10-CM

## 2023-06-06 DIAGNOSIS — E119 Type 2 diabetes mellitus without complications: Secondary | ICD-10-CM

## 2023-06-06 MED ORDER — EMPAGLIFLOZIN 25 MG PO TABS
25.0000 mg | ORAL_TABLET | Freq: Every day | ORAL | 3 refills | Status: AC
Start: 2023-06-06 — End: 2024-05-31

## 2023-06-06 NOTE — Progress Notes (Signed)
   Subjective: Diabetes    Patient ID: Michael Malone, male    DOB: Mar 26, 1971, 52 y.o.   MRN: [id number]  HPI Patient here today for follow-up results of hemoglobin A1c after adding Jardiance to his diabetic medications.  Patient also requests refill of the medication is working.   Review of Systems A-fib and diabetes     Objective:   Physical Exam  Vital signs are not taken.  Patient hemoglobin A1c decreased to 7.5.      Assessment & Plan: Diabetes  Patient has shown a trend of decreased hemoglobin A1c in the last 4 months.  Initially was 8.0 and last reading taken on 05/25/2023 is 7.5.  Advised to continue diabetic medications as directed.  Follow-up as needed.

## 2023-06-06 NOTE — Progress Notes (Signed)
Pt presents today to go over lab results, A1c :7.5

## 2023-07-07 ENCOUNTER — Other Ambulatory Visit: Payer: 59
